# Patient Record
Sex: Female | Born: 1935 | Race: White | Hispanic: No | Smoking: Never smoker
Health system: Southern US, Community
[De-identification: ages and names within clinical notes are randomized; demographics above are authoritative.]

## PROBLEM LIST (undated history)

## (undated) DIAGNOSIS — I499 Cardiac arrhythmia, unspecified: Secondary | ICD-10-CM

## (undated) DIAGNOSIS — D649 Anemia, unspecified: Secondary | ICD-10-CM

## (undated) DIAGNOSIS — M1990 Unspecified osteoarthritis, unspecified site: Secondary | ICD-10-CM

## (undated) DIAGNOSIS — I509 Heart failure, unspecified: Secondary | ICD-10-CM

## (undated) DIAGNOSIS — I10 Essential (primary) hypertension: Secondary | ICD-10-CM

## (undated) HISTORY — PX: CHOLECYSTECTOMY: SHX55

## (undated) HISTORY — PX: ABDOMINAL HYSTERECTOMY: SHX81

## (undated) HISTORY — DX: Cardiac arrhythmia, unspecified: I49.9

## (undated) HISTORY — DX: Anemia, unspecified: D64.9

## (undated) HISTORY — DX: Heart failure, unspecified: I50.9

## (undated) HISTORY — PX: JOINT REPLACEMENT: SHX530

---

## 2004-05-05 ENCOUNTER — Ambulatory Visit: Payer: Self-pay | Admitting: Family Medicine

## 2004-10-18 ENCOUNTER — Ambulatory Visit: Payer: Self-pay | Admitting: Gastroenterology

## 2005-01-30 ENCOUNTER — Other Ambulatory Visit: Payer: Self-pay

## 2005-02-07 ENCOUNTER — Inpatient Hospital Stay: Payer: Self-pay | Admitting: Unknown Physician Specialty

## 2005-05-22 ENCOUNTER — Ambulatory Visit: Payer: Self-pay | Admitting: Family Medicine

## 2006-06-13 ENCOUNTER — Ambulatory Visit: Payer: Self-pay | Admitting: Family Medicine

## 2007-07-24 ENCOUNTER — Ambulatory Visit: Payer: Self-pay | Admitting: Family Medicine

## 2008-10-20 ENCOUNTER — Ambulatory Visit: Payer: Self-pay | Admitting: Family Medicine

## 2009-06-09 ENCOUNTER — Ambulatory Visit: Payer: Self-pay | Admitting: Internal Medicine

## 2009-10-25 ENCOUNTER — Ambulatory Visit: Payer: Self-pay | Admitting: Family Medicine

## 2010-09-28 ENCOUNTER — Ambulatory Visit: Payer: Self-pay | Admitting: Internal Medicine

## 2010-10-04 ENCOUNTER — Ambulatory Visit: Payer: Self-pay | Admitting: Internal Medicine

## 2010-10-27 ENCOUNTER — Ambulatory Visit: Payer: Self-pay | Admitting: Family Medicine

## 2010-11-01 ENCOUNTER — Ambulatory Visit: Payer: Self-pay | Admitting: Family Medicine

## 2011-04-12 ENCOUNTER — Other Ambulatory Visit: Payer: Self-pay | Admitting: Unknown Physician Specialty

## 2011-05-05 ENCOUNTER — Ambulatory Visit: Payer: Self-pay | Admitting: Family Medicine

## 2011-07-02 ENCOUNTER — Emergency Department: Payer: Self-pay | Admitting: Emergency Medicine

## 2011-09-24 ENCOUNTER — Emergency Department: Payer: Self-pay | Admitting: Internal Medicine

## 2012-05-01 LAB — CBC
HCT: 41.9 % (ref 35.0–47.0)
MCHC: 34.1 g/dL (ref 32.0–36.0)
MCV: 100 fL (ref 80–100)
RDW: 12.6 % (ref 11.5–14.5)
WBC: 10.3 10*3/uL (ref 3.6–11.0)

## 2012-05-01 LAB — BASIC METABOLIC PANEL
Anion Gap: 11 (ref 7–16)
BUN: 15 mg/dL (ref 7–18)
Creatinine: 1.07 mg/dL (ref 0.60–1.30)
EGFR (Non-African Amer.): 47 — ABNORMAL LOW
Glucose: 209 mg/dL — ABNORMAL HIGH (ref 65–99)
Osmolality: 283 (ref 275–301)
Potassium: 3.6 mmol/L (ref 3.5–5.1)

## 2012-05-02 ENCOUNTER — Inpatient Hospital Stay: Payer: Self-pay | Admitting: Internal Medicine

## 2012-05-02 LAB — URINALYSIS, COMPLETE
Bilirubin,UR: NEGATIVE
Hyaline Cast: 10
Ketone: NEGATIVE
Leukocyte Esterase: NEGATIVE
Protein: 100
RBC,UR: 3 /HPF (ref 0–5)
Squamous Epithelial: NONE SEEN
WBC UR: 2 /HPF (ref 0–5)

## 2012-05-02 LAB — CK TOTAL AND CKMB (NOT AT ARMC)
CK, Total: 39 U/L (ref 21–215)
CK, Total: 47 U/L (ref 21–215)
CK-MB: 1 ng/mL (ref 0.5–3.6)

## 2012-05-02 LAB — TROPONIN I: Troponin-I: <0.02 ng/mL

## 2012-05-02 LAB — TSH: Thyroid Stimulating Horm: 1.84 u[IU]/mL

## 2012-05-03 LAB — URINE CULTURE

## 2012-05-03 LAB — PROTIME-INR: Prothrombin Time: 30.8 secs — ABNORMAL HIGH (ref 11.5–14.7)

## 2013-10-19 DIAGNOSIS — I480 Paroxysmal atrial fibrillation: Secondary | ICD-10-CM

## 2014-01-02 ENCOUNTER — Emergency Department: Payer: Self-pay | Admitting: Emergency Medicine

## 2014-06-13 ENCOUNTER — Emergency Department: Payer: Self-pay | Admitting: Emergency Medicine

## 2014-10-27 NOTE — Discharge Summary (Signed)
PATIENT NAME:  Gina White, Gina White MR#:  [id number] DATE OF BIRTH:  [birth year]  DATE OF ADMISSION:  05/02/2012 DATE OF DISCHARGE:  05/03/2012  ADMITTING PHYSICIAN: Dr. Enid Baasadhika Geoffrey Mankin DISCHARGING PHYSICIAN: Dr. Enid Baasadhika Binnie Droessler  PRIMARY CARE PHYSICIAN: Dr. Wonda ChengJoel White PRIMARY CARDIOLOGIST: Dr. Lady White  DISCHARGE DIAGNOSES:  1. Atrial fibrillation with rapid ventricular response.  2. Allergic reaction with erythroderma unknown allergen.  3. Hypertension.  4. Hyperlipidemia.  5. Constipation.   DISCHARGE HOME MEDICATIONS:  1. Allegra 180 mg p.o. daily.  2. Coumadin 4 mg p.o. daily.  3. Potassium chloride 10 mEq p.o. daily.  4. Multivitamin 1 tablet p.o. daily.  5. Atorvastatin 20 mg p.o. daily.  6. Detrol LA 4 mg p.o. daily.  7. Calcium vitamin D 600 mg/200 international units 1 tablet t.i.d.  8. Flonase 50 mcg inhalation, nasal spray daily. 9. Glucosamine/chondroitin sulfate 1 tablet p.o. t.i.d.  10. Senna + 50/8.6 mg 2 tablets once a day.  11. Diazepam 2.5 mg at bedtime as needed.  12. Metoprolol 50 mg p.o. b.i.d.  13. Lasix 20 mg p.o. daily.  14. Prednisone start at 30 mg and taper off x10 mg every day.   DISCHARGE DIET: Low sodium diet.   DISCHARGE ACTIVITY: As tolerated.   FOLLOW UP INSTRUCTIONS:  1. Follow up in 1 to 2 weeks with PCP.  2. Follow-up with Dr. Lady White in 3 to 4 weeks for atrial fibrillation. 3. Allergy and immunology follow up in two weeks.   LABORATORY, DIAGNOSTIC AND RADIOLOGICAL DATA: INR at the time of discharge 2.9. CK and CK-MB and troponins have been negative. IgE is elevated at 331.   Urinalysis negative for any infection. Urine cultures are negative. Chest x-ray showing no acute disease of the chest.   WBC 10.3, hemoglobin 14.3, hematocrit 41.9, platelet count 268.   Sodium 138, potassium 3.6, chloride 104, bicarbonate 23, BUN 15, creatinine 1.07, glucose 209, calcium 8.9.   BRIEF HOSPITAL COURSE: Gina White is an 81 year old, very pleasant  Caucasian female with history of hypertension, atrial fibrillation on Coumadin, hyperlipidemia who came in secondary to sudden onset of dyspnea and also whole body erythema, like erythroderma. Denies any use of new medications. Had eaten liver pudding which she hadn't eaten in the past but has been more than six months. No other allergen was identified. She was also found to be in atrial fibrillation with RVR on admission.  1. Atrial fibrillation and rapid ventricular response likely secondary to underlying hypoxia from sudden onset of allergic reaction. She was given IV Cardizem, rate slowed down, admitted to telemetry and metoprolol increased to 50 mg twice a day. Heart rate is controlled at 80 to 90 beats per minute at this time. She will follow with Dr. Lady White as an outpatient. She is also on Coumadin as an outpatient and her INR has been therapeutic while in the hospital.  2. Allergic reaction with erythroderma, redness all over the body, when she came in she was also found to be in acute respiratory failure at the same time and was hypoxic in 60s. She was placed on BiPAP and currently weaned down to room air, doing well. She was started on Solu-Medrol, Pepcid IV and Benadryl for her allergic reaction with she has symptomatically responded. Currently she does not have any rash on her body and sats are well on ambulating too. She was tapered to prednisone and will finish off her course in the next three days. Benadryl has been stopped and was asked to use as needed.  IgE level is elevated, unknown allergen has triggered the reaction so she was advised to follow up with allergist and immunologist as an outpatient. 3. Her other home medications for hypertension, hyperlipemia have been continued without any further changes. Her course has been uneventful while in the hospital.   DISCHARGE CONDITION: Stable.   DISCHARGE DISPOSITION: Home.   TIME SPENT ON DISCHARGE: 45  minutes.  ____________________________ Enid Baas, MD rk:cms D: 05/03/2012 13:51:37 ET T: 05/04/2012 15:34:30 ET JOB#: [id number]  cc: Enid Baas, MD, <Dictator> Durward Mallard. Marguerite Olea, MD Darlin Priestly. Gina Gary, MD Enid Baas MD ELECTRONICALLY SIGNED 05/08/2012 14:15

## 2014-10-27 NOTE — H&P (Signed)
PATIENT NAME:  Gina White, TOWSON MR#:  [id number] DATE OF BIRTH:  1924/06/25  DATE OF ADMISSION:  05/02/2012  ADMITTING PHYSICIAN: Enid Baas, MD    PRIMARY MD: Dr. Marguerite Olea   PRIMARY CARDIOLOGIST: Dr. Lady Gary     CHIEF COMPLAINT: Difficulty breathing, redness all over the body, and also hypoxia.   HISTORY OF PRESENT ILLNESS: Gina White is a pleasant 81 year old elderly Caucasian female with past medical history significant for atrial fibrillation on Coumadin and hypertension who lives at home with her daughter who was brought in secondary to sudden onset of dyspnea and was found to be hypoxic, atrial fibrillation, RVR, and erythroderma all over the body this evening. The patient was on BiPAP when I initially examined her and her daughters are at bedside who give most of the history. According to them, the patient lives with one of the daughters named Gina White. Both of them had her supper around 6 p.m. tonight. They went to their bedrooms and suddenly about 9 p.m. the patient called to the daughter saying that she could not breathe. When the daughter came over she was found to be using her accessory muscles, in distress so 9-1-1 was called. She was hypoxic with sats in the 60's when EMS arrived and also was found to be in atrial fibrillation with RVR, heart rate greater than 120. According to daughter, she initially turned pink all over from head to toe and then became extremely red, beet red color. The patient denies eating any unusual stuff. She had a taco, liver pudding and soup for her supper which she had eaten yesterday too. She is not allergic to any ingredients in that. She has not been started on any new medications or hasn't taken any new medications after supper. She did respond, however, to a dose of IV Solu-Medrol and Benadryl here in the ED and her redness is improving at this time and her breathing is better. She is 100% on 35% FiO2 with 12/6 pressure on the BiPAP. She is now being  changed to nasal cannula and her sats are still 100% on 2 liters at this time and she is not using her accessory muscles for breathing.   PAST MEDICAL HISTORY:  1. Hypertension.  2. Hyperthyroidism, status post radioactive iodine ablation.  3. Atrial fibrillation, on Coumadin.  4. Hyperlipidemia.   PAST SURGICAL HISTORY:  1. Bilateral total knee replacement.  2. Lump removed from her breast, which was benign.  3. Total hysterectomy.  4. Cholecystectomy.  5. Bilateral cataract surgery.  6. Cervical spine surgery.   ALLERGIES TO MEDICATIONS: She is allergic to penicillin, hydrocodone, Septra, Zithromax, and erythromycin.   HOME MEDICATIONS:  1. Metoprolol 50 mg p.o. daily.  2. Allegra 180 mg p.o. daily.  3. Warfarin 4 mg p.o. daily.  4. Potassium 10 mEq p.o. daily.  5. Atorvastatin 20 mg p.o. daily.  6. Aspirin 81 mg p.o. daily.  7. Calcium with Vitamin D 600 mg plus 200 international units 1 tablet twice a day.  8. Multivitamin 1 tablet p.o. daily.  9. Diazepam 2.5 mg at bedtime for sleeping.  10. Fluticasone nasal spray once a day.  11. Chlorhexidine mouthwash twice a day.  12. Glucosamine/chondroitin sulfate 1 tablet twice a day.  13. Benadryl as needed.  14. Senna Plus 2 tablets at bedtime.  15. Lasix 40 mg p.o. daily.  16. Tylenol 500 mg q.6 hours p.r.n.  17. 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   SOCIAL HISTORY: Lives at home with her daughter. No smoking or alcohol use.   FAMILY HISTORY: Dad had emphysema. Mom lived up to 50's and died from old age.   REVIEW OF SYSTEMS: CONSTITUTIONAL: No fever, fatigue, or weakness. EYES: No blurred vision, double vision, glaucoma, or inflammation. Positive for cataract surgery. ENT: No tinnitus, ear pain, epistaxis, or discharge. RESPIRATORY: No cough or wheezing but she did come with severe dyspnea when she initially came in. CARDIOVASCULAR: No chest pain,  orthopnea, edema, arrhythmia, palpitations, or syncope. GI: No nausea, vomiting, abdominal pain, hematemesis, or melena. GU: No dysuria, hematuria, renal calculus, frequency, or incontinence. ENDOCRINE: No polyuria or nocturia. History of thyroid nodule present. No heat or cold intolerance. HEMATOLOGY: No anemia, easy bruising or bleeding. SKIN: Positive for extreme redness of whole body. She came in improving. MUSCULOSKELETAL: No neck pain, shoulder pain, arthritis, or gout. NEUROLOGIC: No numbness, weakness, CVA, TIA, or seizures. PSYCHOLOGICAL: No anxiety, insomnia, or depression.   PHYSICAL EXAMINATION:   VITAL SIGNS: Temperature 97 degrees Fahrenheit, pulse 91, respirations 24, blood pressure 131/66, pulse oximetry 100% on BiPAP.  GENERAL: Elderly female well built, well nourished sitting in bed not in any acute distress.   HEENT: Normocephalic, atraumatic. Pupils equal, round, reacting to light. Anicteric sclerae. Extraocular movements intact. Oropharynx clear without erythema, mass, or exudates.   NECK: Supple. No thyromegaly. No JVD or carotid bruits. No lymphadenopathy.   LUNGS: She is moving air bilaterally. No wheeze or crackles. No use of accessory muscles for breathing.   CARDIOVASCULAR: S1, S2. Irregular rhythm. Normal rate. No murmurs, rubs, or gallops.   ABDOMEN: Soft, nontender, nondistended. No hepatosplenomegaly. Normal bowel sounds.   EXTREMITIES: 1+ pedal edema. No clubbing or cyanosis. 2+ dorsalis pedis pulses palpable bilaterally.   SKIN: There is generalized redness of the skin more prominent in the lower extremities and also trunk especially on the back and, according to family, much improving now. There is occasional old bruising from being on Coumadin and sensitive skin but no open ulcers or rashes seen otherwise.   LYMPHATICS: No cervical lymphadenopathy.   NEUROLOGIC: Cranial nerves intact. No focal motor or sensory deficits.   PSYCHOLOGICAL: The patient is  awake, alert, oriented x3.   LABORATORY DATA: WBC 10.3, hemoglobin 14.3, hematocrit 41.9, platelet count 268, sodium 138, potassium 3.6, chloride 104, bicarb 23, BUN 15, creatinine 1.07, glucose 209, calcium 8.9. Troponin less than 0.02. INR 2.1. ABG showing pH 7.32, pCO2 46, pO2 514, bicarb 23.7, sats of 100% on 100% FiO2. Urinalysis showing 100 mg/dL of protein, 1+ bacteria, and 2 WBCs.   Chest x-ray showing hyperinflated lung fields with prominent right hilum pulmonary vascularity but no pleural effusion, pneumonia, or CHF findings are seen.   EKG atrial fibrillation with heart rate of 82.   ASSESSMENT AND PLAN: This is an 81 year old female with history of atrial fibrillation on Coumadin and hypertension brought in for hypoxia, sudden onset of dyspnea, atrial fibrillation with RVR. Also, the patient had erythroderma with the onset of dyspnea with possible allergic reaction.  1. Atrial fibrillation with rapid ventricular response likely secondary to hypoxia and dyspnea, required IV Cardizem push and now rate is slowed down. Will admit to telemetry. Continue metoprolol p.o. and will add IV metoprolol p.r.n. if needed. She is on Coumadin for anticoagulation. Her INR is therapeutic and she follows with Dr. Lady Gary as outpatient.  2. Hypoxic respiratory failure with sudden onset of dyspnea and erythroderma, possible allergic reaction, unknown  triggering factor. No new medications. Not sure if the food she ate had some new ingredient in it, however, she denies. Will get IgE level and might need allergy testing as an outpatient. Was on BiPAP for her hypoxia. Sats improved. No change to nasal cannula. She did get Benadryl and Solu-Medrol here so will continue Benadryl p.r.n. and Solu-Medrol IV q.8 hours and IV Pepcid. Monitor on telemetry for now.  3. Hyperlipidemia. Continue statin.  4. Constipation. Senna and Colace.  5. GI and DVT prophylaxis. On ranitidine and also Coumadin.   CODE STATUS: FULL CODE.         TIME SPENT ON ADMISSION: 50 minutes.   ____________________________ Enid Baasadhika Francessca Friis, MD rk:drc D: 05/02/2012 01:32:46 ET T: 05/02/2012 07:45:48 ET JOB#: [id number]  cc: Enid Baasadhika Isidro Monks, MD, <Dictator> Durward MallardJoel B. Marguerite OleaMoffett, MD Darlin PriestlyKenneth A. Lady GaryFath, MD Enid BaasADHIKA Aki Burdin MD ELECTRONICALLY SIGNED 05/03/2012 11:26

## 2015-03-19 ENCOUNTER — Other Ambulatory Visit: Payer: Self-pay | Admitting: Otolaryngology

## 2015-03-19 DIAGNOSIS — H905 Unspecified sensorineural hearing loss: Secondary | ICD-10-CM

## 2015-03-26 ENCOUNTER — Ambulatory Visit: Payer: Medicare Other

## 2015-11-04 ENCOUNTER — Emergency Department
Admission: EM | Admit: 2015-11-04 | Discharge: 2015-11-05 | Disposition: A | Discharge status: Home / Self Care | Payer: Medicare Other | Attending: Emergency Medicine | Admitting: Emergency Medicine

## 2015-11-04 DIAGNOSIS — T7840XA Allergy, unspecified, initial encounter: Secondary | ICD-10-CM | POA: Insufficient documentation

## 2015-11-04 DIAGNOSIS — Z79899 Other long term (current) drug therapy: Secondary | ICD-10-CM | POA: Diagnosis not present

## 2015-11-04 DIAGNOSIS — Z880 Allergy status to penicillin: Secondary | ICD-10-CM | POA: Diagnosis not present

## 2015-11-04 DIAGNOSIS — Z7982 Long term (current) use of aspirin: Secondary | ICD-10-CM | POA: Insufficient documentation

## 2015-11-04 DIAGNOSIS — I4891 Unspecified atrial fibrillation: Secondary | ICD-10-CM | POA: Diagnosis not present

## 2015-11-04 DIAGNOSIS — R21 Rash and other nonspecific skin eruption: Secondary | ICD-10-CM | POA: Diagnosis present

## 2015-11-04 DIAGNOSIS — L509 Urticaria, unspecified: Secondary | ICD-10-CM | POA: Diagnosis not present

## 2015-11-04 MED ORDER — FAMOTIDINE IN NACL 20-0.9 MG/50ML-% IV SOLN
20.0000 mg | Freq: Once | INTRAVENOUS | Status: AC
  Administered 2015-11-05: 20 mg via INTRAVENOUS
  Filled 2015-11-04: qty 50

## 2015-11-04 MED ORDER — DIPHENHYDRAMINE HCL 50 MG/ML IJ SOLN
12.5000 mg | Freq: Once | INTRAMUSCULAR | Status: AC
  Administered 2015-11-05: 12.5 mg via INTRAVENOUS
  Filled 2015-11-04: qty 1

## 2015-11-04 MED ORDER — METHYLPREDNISOLONE SODIUM SUCC 125 MG IJ SOLR
125.0000 mg | Freq: Once | INTRAMUSCULAR | Status: AC
  Administered 2015-11-05: 125 mg via INTRAVENOUS
  Filled 2015-11-04: qty 2

## 2015-11-04 NOTE — ED Notes (Signed)
Pt c/o rash to RT arm and lower legs, rash is scattered throughout. Family states around 1500 rash was very slight and has now spread. Rt arm is red and raised. PT c/o severe itching but no pain. Denies any new meds or household products

## 2015-11-04 NOTE — ED Provider Notes (Signed)
[facility] Emergency Department Provider Note  ____________________________________________  Time seen: Approximately 11:15 PM  I have reviewed the triage vital signs and the nursing notes.   HISTORY  Chief Complaint Rash    HPI Gina White is a 82 y.o. female who presents to the ED from home with a chief complaint of rash. Patient noted a quarter-sized hive to her right upper extremity this afternoon. Subsequently the area spread to her medial humerus with areas of blistering. Also note spread of hives to her lower extremities and trunk. Denies associated respiratory distress, tongue or facial swelling, or difficulty swallowing. Denies new medicines, including antibiotics and over-the-counter medications. Denies new foods, lotions, detergents, soaps or other exposures. Family does note that patient worked in the yard yesterday pulling out weeds. Denies recent travel or trauma. Denies associated fever, chills, chest pain, shortness of breath, abdominal pain, nausea, vomiting, diarrhea. Patient took one Benadryl approximately 2 PM without relief of symptoms. She has an EpiPen at home but did not use it for the symptoms which did not involve her airway. Daughter states EpiPen was prescribed for an episode of lethargy which required hospitalization; states etiology of that allergic reaction was unknown.   Past medical history Atrial fibrillation  There are no active problems to display for this patient.   No past surgical history on file.  Current Outpatient Rx  Name  Route  Sig  Dispense  Refill  . acetaminophen (TYLENOL) 500 MG tablet   Oral   Take 500 mg by mouth every 6 (six) hours as needed.         Marland Kitchen aspirin EC 81 MG tablet   Oral   Take 81 mg by mouth daily.         Marland Kitchen atorvastatin (LIPITOR) 20 MG tablet   Oral   Take 20 mg by mouth daily at 6 PM.         . bisacodyl (DULCOLAX) 5 MG EC tablet   Oral   Take 5 mg by mouth daily as  needed for moderate constipation.         . Calcium Carbonate-Vitamin D (CALCIUM 600+D) 600-400 MG-UNIT tablet   Oral   Take 2 tablets by mouth daily.         . cetirizine (ZYRTEC) 10 MG tablet   Oral   Take 10 mg by mouth daily.         Marland Kitchen dexamethasone 0.5 MG/5ML elixir   Oral   Take 1 mg by mouth 3 (three) times daily.         Marland Kitchen dextromethorphan-guaiFENesin (ROBITUSSIN-DM) 10-100 MG/5ML liquid   Oral   Take 10 mLs by mouth every 4 (four) hours as needed for cough.         . diazepam (VALIUM) 5 MG tablet   Oral   Take 5 mg by mouth at bedtime as needed for anxiety.         . diphenhydrAMINE (SOMINEX) 25 MG tablet   Oral   Take 25 mg by mouth at bedtime as needed for sleep.         Marland Kitchen EPINEPHrine 0.3 mg/0.3 mL IJ SOAJ injection   Intramuscular   Inject 0.3 mg into the muscle once.         . fluticasone (FLONASE) 50 MCG/ACT nasal spray   Each Nare   Place 2 sprays into both nostrils daily.         . furosemide (LASIX) 20 MG tablet   Oral  Take 40 mg by mouth daily.         Marland Kitchen gabapentin (NEURONTIN) 300 MG capsule   Oral   Take 300-600 mg by mouth at bedtime.          Marland Kitchen glucosamine-chondroitin 500-400 MG tablet   Oral   Take 1 tablet by mouth 2 (two) times daily.         Marland Kitchen HYDROcodone-acetaminophen (NORCO/VICODIN) 5-325 MG tablet   Oral   Take 1 tablet by mouth every 8 (eight) hours as needed for moderate pain.         Marland Kitchen HYDROcodone-homatropine (HYCODAN) 5-1.5 MG/5ML syrup   Oral   Take 5 mLs by mouth daily as needed for cough.         . metaxalone (SKELAXIN) 800 MG tablet   Oral   Take 800 mg by mouth 3 (three) times daily.         . metoprolol succinate (TOPROL-XL) 50 MG 24 hr tablet   Oral   Take 50 mg by mouth 2 (two) times daily. Take with or immediately following a meal.         . Multiple Vitamins-Minerals (MULTIVITAMIN WITH MINERALS) tablet   Oral   Take 1 tablet by mouth daily.         . potassium chloride  (MICRO-K) 10 MEQ CR capsule   Oral   Take 10 mEq by mouth daily.         . sennosides-docusate sodium (SENOKOT-S) 8.6-50 MG tablet   Oral   Take 1 tablet by mouth daily.         . traMADol (ULTRAM) 50 MG tablet   Oral   Take by mouth every 6 (six) hours as needed.         . triamcinolone (KENALOG) 0.1 % paste   Mouth/Throat   Use as directed 1 application in the mouth or throat 2 (two) times daily.         Marland Kitchen warfarin (COUMADIN) 5 MG tablet   Oral   Take 5 mg by mouth daily.           Allergies Penicillins  No family history on file.  Social History Social History  Substance Use Topics  . Smoking status: Not on file  . Smokeless tobacco: Not on file  . Alcohol Use: Not on file  Nonsmoker  Review of Systems  Constitutional: No fever/chills. Eyes: No visual changes. ENT: No sore throat. Cardiovascular: Denies chest pain. Respiratory: Denies shortness of breath. Gastrointestinal: No abdominal pain.  No nausea, no vomiting.  No diarrhea.  No constipation. Genitourinary: Negative for dysuria. Musculoskeletal: Negative for back pain. Skin: Positive for rash. Neurological: Negative for headaches, focal weakness or numbness.  10-point ROS otherwise negative.  ____________________________________________   PHYSICAL EXAM:  VITAL SIGNS: ED Triage Vitals  Enc Vitals Group     BP 11/04/15 2152 114/81 mmHg     Pulse Rate 11/04/15 2152 88     Resp 11/04/15 2152 18     Temp 11/04/15 2152 98.2 F (36.8 C)     Temp Source 11/04/15 2152 Oral     SpO2 11/04/15 2152 98 %     Weight 11/04/15 2152 154 lb (69.854 kg)     Height 11/04/15 2152  (1.626 m)     Head Cir --      Peak Flow --      Pain Score 11/04/15 2247 0     Pain Loc --  Pain Edu? --      Excl. in GC? --     Constitutional: Alert and oriented. Well appearing and in no acute distress. Eyes: Conjunctivae are normal. PERRL. EOMI. Head: Atraumatic. Nose: No  congestion/rhinnorhea. Mouth/Throat: Mucous membranes are moist.  Oropharynx non-erythematous.  There is no angioedema, tongue swelling, hoarse/muffled voice, or drooling.  Small contusion left lower chin which patient reports is from her warfarin use. Neck: No stridor.  No brawny edema. Cardiovascular: Normal rate, irregular rhythm. Grossly normal heart sounds.  Good peripheral circulation. Respiratory: Normal respiratory effort.  No retractions. Lungs CTAB. Gastrointestinal: Soft and nontender. No distention. No abdominal bruits. No CVA tenderness. Musculoskeletal: No lower extremity tenderness nor edema.  No joint effusions. Neurologic:  Normal speech and language. No gross focal neurologic deficits are appreciated. No gait instability. Skin:  Skin is warm, dry and intact. No petechiae. Large area to right upper extremity with patchy urticaria and areas of blistering/bulla. Bulla is not tense. There is no Nikolsky's sign. Scattered urticaria to bilateral upper legs, left forearm and trunk, particularly the waistband. Psychiatric: Mood and affect are normal. Speech and behavior are normal.  ____________________________________________   LABS (all labs ordered are listed, but only abnormal results are displayed)  Labs Reviewed  CBC WITH DIFFERENTIAL/PLATELET - Abnormal; Notable for the following:    RBC 3.75 (*)    Hemoglobin 11.6 (*)    HCT 34.7 (*)    All other components within normal limits  BASIC METABOLIC PANEL - Abnormal; Notable for the following:    Potassium 3.1 (*)    Glucose, Bld 113 (*)    GFR calc non Af Amer 54 (*)    All other components within normal limits  PROTIME-INR - Abnormal; Notable for the following:    Prothrombin Time 24.5 (*)    All other components within normal limits    ____________________________________________  EKG  None ____________________________________________  RADIOLOGY  None ____________________________________________   PROCEDURES  Procedure(s) performed: None  Critical Care performed: No  ____________________________________________   INITIAL IMPRESSION / ASSESSMENT AND PLAN / ED COURSE  Pertinent labs & imaging results that were available during my care of the patient were reviewed by me and considered in my medical decision making (see chart for details).  82 year old female who presents with pruritic rash with urticaria and scattered blistering to right upper extremity. Denies new medicines or exposures other than gardening. Will obtain screening lab work including INR, administer IV Benadryl, Solu-Medrol, Pepcid and reassess.  ----------------------------------------- 12:48 AM on 11/05/2015 -----------------------------------------  Updated patient and family members of laboratory results. Patient reports pruritus is improved. No new areas of urticaria or blistering. Will continue to monitor.  ----------------------------------------- 2:51 AM on 11/05/2015 -----------------------------------------  Patient smiling visiting with her daughters. Urticaria improved. Room air saturations 98%. There is no angioedema or tongue swelling. Plan for low-dose prednisone for the next 4 days, Pepcid 4 days and follow-up in 12-18 hours either with patient's PCP or return to the ED for recheck. Strict return precautions given. All verbalize understanding and agree with plan of care. ____________________________________________   FINAL CLINICAL IMPRESSION(S) / ED DIAGNOSES  Final diagnoses:  Allergic reaction, initial encounter  Urticaria      Irean HongJade J Torre Pikus, MD 11/05/15 [phone] [id number]

## 2015-11-04 NOTE — ED Notes (Signed)
Pt in with co rash all over with some blistering area to right arm, no injury or hx of the same.  States no pain but it does itch.

## 2015-11-05 LAB — CBC WITH DIFFERENTIAL/PLATELET
Basophils Absolute: 0 10*3/uL (ref 0–0.1)
Basophils Relative: 0 %
EOS ABS: 0.2 10*3/uL (ref 0–0.7)
EOS PCT: 2 %
HCT: 34.7 % — ABNORMAL LOW (ref 35.0–47.0)
Hemoglobin: 11.6 g/dL — ABNORMAL LOW (ref 12.0–16.0)
LYMPHS ABS: 2.7 10*3/uL (ref 1.0–3.6)
Lymphocytes Relative: 28 %
MCH: 31 pg (ref 26.0–34.0)
MCHC: 33.6 g/dL (ref 32.0–36.0)
MCV: 92.3 fL (ref 80.0–100.0)
MONO ABS: 0.9 10*3/uL (ref 0.2–0.9)
Monocytes Relative: 9 %
NEUTROS PCT: 61 %
Neutro Abs: 5.7 10*3/uL (ref 1.4–6.5)
PLATELETS: 210 10*3/uL (ref 150–440)
RBC: 3.75 MIL/uL — AB (ref 3.80–5.20)
RDW: 14.2 % (ref 11.5–14.5)
WBC: 9.5 10*3/uL (ref 3.6–11.0)

## 2015-11-05 LAB — BASIC METABOLIC PANEL
ANION GAP: 8 (ref 5–15)
BUN: 14 mg/dL (ref 6–20)
CALCIUM: 9.2 mg/dL (ref 8.9–10.3)
CO2: 30 mmol/L (ref 22–32)
Chloride: 104 mmol/L (ref 101–111)
Creatinine, Ser: 0.91 mg/dL (ref 0.44–1.00)
GFR calc Af Amer: >60 mL/min (ref >60)
GFR, EST NON AFRICAN AMERICAN: 54 mL/min — AB (ref >60)
GLUCOSE: 113 mg/dL — AB (ref 65–99)
Potassium: 3.1 mmol/L — ABNORMAL LOW (ref 3.5–5.1)
SODIUM: 142 mmol/L (ref 135–145)

## 2015-11-05 LAB — PROTIME-INR
INR: 2.23
Prothrombin Time: 24.5 seconds — ABNORMAL HIGH (ref 11.4–15.0)

## 2015-11-05 MED ORDER — PREDNISONE 20 MG PO TABS: ORAL_TABLET | ORAL | Status: DC

## 2015-11-05 MED ORDER — FAMOTIDINE 20 MG PO TABS: 20.0000 mg | ORAL_TABLET | Freq: Two times a day (BID) | ORAL | Status: DC

## 2015-11-05 NOTE — Discharge Instructions (Signed)
1. Take the following medicines for the next 4 days: Prednisone  daily Pepcid  twice daily 2. Take Benadryl as needed for itching. 3. Use your Epi-Pen in case of acute, life-threatening allergic reaction which prevents you from breathing. 4. Please see your PCP today. If you are unable to see your PCP today, please return to the ER in 12-24 hours for recheck.  5. Return to the ER sooner for worsening symptoms, persistent vomiting, difficulty breathing or other concerns.  Hives Hives are itchy, red, swollen areas of the skin. They can vary in size and location on your body. Hives can come and go for hours or several days (acute hives) or for several weeks (chronic hives). Hives do not spread from person to person (noncontagious). They may get worse with scratching, exercise, and emotional stress. CAUSES   Allergic reaction to food, additives, or drugs.  Infections, including the common cold.  Illness, such as vasculitis, lupus, or thyroid disease.  Exposure to sunlight, heat, or cold.  Exercise.  Stress.  Contact with chemicals. SYMPTOMS   Red or white swollen patches on the skin. The patches may change size, shape, and location quickly and repeatedly.  Itching.  Swelling of the hands, feet, and face. This may occur if hives develop deeper in the skin. DIAGNOSIS  Your caregiver can usually tell what is wrong by performing a physical exam. Skin or blood tests may also be done to determine the cause of your hives. In some cases, the cause cannot be determined. TREATMENT  Mild cases usually get better with medicines such as antihistamines. Severe cases may require an emergency epinephrine injection. If the cause of your hives is known, treatment includes avoiding that trigger.  HOME CARE INSTRUCTIONS   Avoid causes that trigger your hives.  Take antihistamines as directed by your caregiver to reduce the severity of your hives. Non-sedating or low-sedating antihistamines  are usually recommended. Do not drive while taking an antihistamine.  Take any other medicines prescribed for itching as directed by your caregiver.  Wear loose-fitting clothing.  Keep all follow-up appointments as directed by your caregiver. SEEK MEDICAL CARE IF:   You have persistent or severe itching that is not relieved with medicine.  You have painful or swollen joints. SEEK IMMEDIATE MEDICAL CARE IF:   You have a fever.  Your tongue or lips are swollen.  You have trouble breathing or swallowing.  You feel tightness in the throat or chest.  You have abdominal pain. These problems may be the first sign of a life-threatening allergic reaction. Call your local emergency services (911 in [place] MAKE SURE YOU:   Understand these instructions.  Will watch your condition.  Will get help right away if you are not doing well or get worse.   This information is not intended to replace advice given to you by your health care provider. Make sure you discuss any questions you have with your health care provider.   Document Released: 06/26/2005 Document Revised: 07/01/2013 Document Reviewed: 09/19/2011 Elsevier Interactive Patient Education [employer]  Allergies An allergy is an abnormal reaction to a substance by the body's defense system (immune system). Allergies can develop at any age. WHAT CAUSES ALLERGIES? An allergic reaction happens when the immune system mistakenly reacts to a normally harmless substance, called an allergen, as if it were harmful. The immune system releases antibodies to fight the substance. Antibodies eventually release a chemical called histamine into the bloodstream. The release of histamine is meant to protect  the body from infection, but it also causes discomfort. An allergic reaction can be triggered by:  Eating an allergen.  Inhaling an allergen.  Touching an allergen. WHAT TYPES OF ALLERGIES ARE THERE? There are many types of  allergies. Common types include:  Seasonal allergies. People with this type of allergy are usually allergic to substances that are only present during certain seasons, such as molds and pollens.  Food allergies.  Drug allergies.  Insect allergies.  Animal dander allergies. WHAT ARE SYMPTOMS OF ALLERGIES? Possible allergy symptoms include:  Swelling of the lips, face, tongue, mouth, or throat.  Sneezing, coughing, or wheezing.  Nasal congestion.  Tingling in the mouth.  Rash.  Itching.  Itchy, red, swollen areas of skin (hives).  Watery eyes.  Vomiting.  Diarrhea.  Dizziness.  Lightheadedness.  Fainting.  Trouble breathing or swallowing.  Chest tightness.  Rapid heartbeat. HOW ARE ALLERGIES DIAGNOSED? Allergies are diagnosed with a medical and family history and one or more of the following:  Skin tests.  Blood tests.  A food diary. A food diary is a record of all the foods and drinks you have in a day and of all the symptoms you experience.  The results of an elimination diet. An elimination diet involves eliminating foods from your diet and then adding them back in one by one to find out if a certain food causes an allergic reaction. HOW ARE ALLERGIES TREATED? There is no cure for allergies, but allergic reactions can be treated with medicine. Severe reactions usually need to be treated at a hospital. HOW CAN REACTIONS BE PREVENTED? The best way to prevent an allergic reaction is by avoiding the substance you are allergic to. Allergy shots and medicines can also help prevent reactions in some cases. People with severe allergic reactions may be able to prevent a life-threatening reaction called anaphylaxis with a medicine given right after exposure to the allergen.   This information is not intended to replace advice given to you by your health care provider. Make sure you discuss any questions you have with your health care provider.   Document Released:  09/19/2002 Document Revised: 07/17/2014 Document Reviewed: 04/07/2014 Elsevier Interactive Patient Education [employer]

## 2016-04-02 ENCOUNTER — Ambulatory Visit
Admission: EM | Admit: 2016-04-02 | Discharge: 2016-04-02 | Disposition: A | Discharge status: Home / Self Care | Payer: Medicare Other | Attending: Family Medicine | Admitting: Family Medicine

## 2016-04-02 ENCOUNTER — Encounter: Payer: Self-pay | Admitting: *Deleted

## 2016-04-02 ENCOUNTER — Ambulatory Visit (INDEPENDENT_AMBULATORY_CARE_PROVIDER_SITE_OTHER): Payer: Medicare Other

## 2016-04-02 DIAGNOSIS — B9789 Other viral agents as the cause of diseases classified elsewhere: Principal | ICD-10-CM

## 2016-04-02 DIAGNOSIS — J069 Acute upper respiratory infection, unspecified: Secondary | ICD-10-CM

## 2016-04-02 HISTORY — DX: Essential (primary) hypertension: I10

## 2016-04-02 HISTORY — DX: Unspecified osteoarthritis, unspecified site: M19.90

## 2016-04-02 NOTE — ED Triage Notes (Signed)
Productive cough, fever up to 104 at times, head congestion and runny nose x3-4 days.

## 2016-04-02 NOTE — ED Provider Notes (Signed)
[facility]    CSN: [id number] Arrival date & time: 04/02/16  4098  First Provider Contact:  None       History   Chief Complaint Chief Complaint  Patient presents with  . Cough  . Fever  . Nasal Congestion  . Sore Throat    HPI Gina White is a 82 y.o. female.   The history is provided by the patient.  URI  Presenting symptoms: congestion, cough, fever and rhinorrhea   Severity:  Moderate Onset quality:  Sudden Duration:  4 days Timing:  Constant Progression:  Worsening Chronicity:  New Associated symptoms: no wheezing   Risk factors: being elderly, chronic cardiac disease, recent illness and sick contacts   Risk factors: no chronic kidney disease, no chronic respiratory disease, no diabetes mellitus, no immunosuppression and no recent travel     Past Medical History:  Diagnosis Date  . Arthritis   . Hypertension     There are no active problems to display for this patient.   Past Surgical History:  Procedure Laterality Date  . ABDOMINAL HYSTERECTOMY    . CHOLECYSTECTOMY    . JOINT REPLACEMENT      OB History    No data available       Home Medications    Prior to Admission medications   Medication Sig Start Date End Date Taking? Authorizing Provider  acetaminophen (TYLENOL) 500 MG tablet Take 500 mg by mouth every 6 (six) hours as needed.   Yes Historical Provider, MD  aspirin EC 81 MG tablet Take 81 mg by mouth daily.   Yes Historical Provider, MD  atorvastatin (LIPITOR) 20 MG tablet Take 20 mg by mouth daily at 6 PM.   Yes Historical Provider, MD  bisacodyl (DULCOLAX) 5 MG EC tablet Take 5 mg by mouth daily as needed for moderate constipation.   Yes Historical Provider, MD  Calcium Carbonate-Vitamin D (CALCIUM 600+D) 600-400 MG-UNIT tablet Take 2 tablets by mouth daily.   Yes Historical Provider, MD  cetirizine (ZYRTEC) 10 MG tablet Take 10 mg by mouth daily.   Yes Historical Provider, MD  dexamethasone 0.5 MG/5ML elixir Take  1 mg by mouth 3 (three) times daily.   Yes Historical Provider, MD  diazepam (VALIUM) 5 MG tablet Take 5 mg by mouth at bedtime as needed for anxiety.   Yes Historical Provider, MD  fluticasone (FLONASE) 50 MCG/ACT nasal spray Place 2 sprays into both nostrils daily.   Yes Historical Provider, MD  furosemide (LASIX) 20 MG tablet Take 40 mg by mouth daily.   Yes Historical Provider, MD  gabapentin (NEURONTIN) 300 MG capsule Take 300-600 mg by mouth at bedtime.    Yes Historical Provider, MD  glucosamine-chondroitin 500-400 MG tablet Take 1 tablet by mouth 2 (two) times daily.   Yes Historical Provider, MD  metoprolol succinate (TOPROL-XL) 50 MG 24 hr tablet Take 50 mg by mouth 2 (two) times daily. Take with or immediately following a meal.   Yes Historical Provider, MD  Multiple Vitamins-Minerals (MULTIVITAMIN WITH MINERALS) tablet Take 1 tablet by mouth daily.   Yes Historical Provider, MD  potassium chloride (MICRO-K) 10 MEQ CR capsule Take 10 mEq by mouth daily.   Yes Historical Provider, MD  traMADol (ULTRAM) 50 MG tablet Take by mouth every 6 (six) hours as needed.   Yes Historical Provider, MD  warfarin (COUMADIN) 5 MG tablet Take 5 mg by mouth daily.   Yes Historical Provider, MD  dextromethorphan-guaiFENesin (ROBITUSSIN-DM) 10-100 MG/5ML liquid  Take 10 mLs by mouth every 4 (four) hours as needed for cough.    Historical Provider, MD  diphenhydrAMINE (SOMINEX) 25 MG tablet Take 25 mg by mouth at bedtime as needed for sleep.    Historical Provider, MD  EPINEPHrine 0.3 mg/0.3 mL IJ SOAJ injection Inject 0.3 mg into the muscle once.    Historical Provider, MD  famotidine (PEPCID) 20 MG tablet Take 1 tablet (20 mg total) by mouth 2 (two) times daily. 11/05/15   Irean Hong, MD  HYDROcodone-acetaminophen (NORCO/VICODIN) 5-325 MG tablet Take 1 tablet by mouth every 8 (eight) hours as needed for moderate pain.    Historical Provider, MD  HYDROcodone-homatropine (HYCODAN) 5-1.5 MG/5ML syrup Take 5 mLs  by mouth daily as needed for cough.    Historical Provider, MD  metaxalone (SKELAXIN) 800 MG tablet Take 800 mg by mouth 3 (three) times daily.    Historical Provider, MD  predniSONE (DELTASONE) 20 MG tablet 2 tablets daily 4 days 11/05/15   Irean Hong, MD  sennosides-docusate sodium (SENOKOT-S) 8.6-50 MG tablet Take 1 tablet by mouth daily.    Historical Provider, MD  triamcinolone (KENALOG) 0.1 % paste Use as directed 1 application in the mouth or throat 2 (two) times daily.    Historical Provider, MD    Family History History reviewed. No pertinent family history.  Social History Social History  Substance Use Topics  . Smoking status: Never Smoker  . Smokeless tobacco: Current User    Types: Snuff  . Alcohol use No     Allergies   Penicillins   Review of Systems Review of Systems  Constitutional: Positive for fever.  HENT: Positive for congestion and rhinorrhea.   Respiratory: Positive for cough. Negative for wheezing.      Physical Exam Triage Vital Signs ED Triage Vitals  Enc Vitals Group     BP 04/02/16 0911 128/78     Pulse Rate 04/02/16 0911 98     Resp 04/02/16 0911 16     Temp 04/02/16 0911 97.6 F (36.4 C)     Temp Source 04/02/16 0911 Oral     SpO2 04/02/16 0911 98 %     Weight 04/02/16 0914 154 lb (69.9 kg)     Height 04/02/16 0914 5\' 2"  (1.575 m)     Head Circumference --      Peak Flow --      Pain Score --      Pain Loc --      Pain Edu? --      Excl. in GC? --    No data found.   Updated Vital Signs BP 128/78   Pulse 98   Temp 97.6 F (36.4 C) (Oral)   Resp 16   Ht 5\' 2"  (1.575 m)   Wt 154 lb (69.9 kg)   SpO2 98%   BMI 28.17 kg/m   Visual Acuity Right Eye Distance:   Left Eye Distance:   Bilateral Distance:    Right Eye Near:   Left Eye Near:    Bilateral Near:     Physical Exam  Constitutional: She appears well-developed and well-nourished. No distress.  HENT:  Head: Normocephalic and atraumatic.  Right Ear: Tympanic  membrane, external ear and ear canal normal.  Left Ear: Tympanic membrane, external ear and ear canal normal.  Nose: Mucosal edema and rhinorrhea present. No nose lacerations, sinus tenderness, nasal deformity, septal deviation or nasal septal hematoma. No epistaxis.  No foreign bodies. Right sinus exhibits maxillary  sinus tenderness and frontal sinus tenderness. Left sinus exhibits maxillary sinus tenderness and frontal sinus tenderness.  Mouth/Throat: Uvula is midline, oropharynx is clear and moist and mucous membranes are normal. No oropharyngeal exudate.  Eyes: Conjunctivae and EOM are normal. Pupils are equal, round, and reactive to light. Right eye exhibits no discharge. Left eye exhibits no discharge. No scleral icterus.  Neck: Normal range of motion. Neck supple. No thyromegaly present.  Cardiovascular: Normal rate, regular rhythm and normal heart sounds.   Pulmonary/Chest: Effort normal. No respiratory distress. She has no wheezes. She has rhonchi in the right lower field. She has no rales.  Lymphadenopathy:    She has no cervical adenopathy.  Skin: She is not diaphoretic.  Nursing note and vitals reviewed.    UC Treatments / Results  Labs (all labs ordered are listed, but only abnormal results are displayed) Labs Reviewed - No data to display  EKG  EKG Interpretation None       Radiology Dg Chest 2 View  Result Date: 04/02/2016 CLINICAL DATA:  Cough for 2 days.  Hypertension. EXAM: CHEST  2 VIEW COMPARISON:  05/01/2012 FINDINGS: Osteopenia. Patient rotated left on the frontal. Midline trachea. Moderate cardiomegaly. Atherosclerosis in the transverse aorta. No pleural effusion or pneumothorax. Clear lungs. No congestive failure. IMPRESSION: No acute cardiopulmonary disease. Cardiomegaly without congestive failure. Aortic atherosclerosis. Electronically Signed   By: Jeronimo GreavesKyle  Talbot M.D.   On: 04/02/2016 09:58    Procedures Procedures (including critical care  time)  Medications Ordered in UC Medications - No data to display   Initial Impression / Assessment and Plan / UC Course  I have reviewed the triage vital signs and the nursing notes.  Pertinent labs & imaging results that were available during my care of the patient were reviewed by me and considered in my medical decision making (see chart for details).  Clinical Course      Final Clinical Impressions(s) / UC Diagnoses   Final diagnoses:  Viral URI with cough    New Prescriptions New Prescriptions   No medications on file   1. x-ray results (negative) and diagnosis reviewed with patient 2. Recommend supportive treatment with  3. Follow-up prn if symptoms worsen or don't improve   Payton Mccallumrlando Wister Hoefle, MD 04/02/16 1036

## 2016-04-04 ENCOUNTER — Telehealth: Payer: Self-pay

## 2016-04-04 NOTE — Telephone Encounter (Signed)
Courtesy call back completed today after patients visit at [facility]. Patient improved and will follow up with their PCP if symptoms continue or worsen.   

## 2017-01-15 ENCOUNTER — Ambulatory Visit (INDEPENDENT_AMBULATORY_CARE_PROVIDER_SITE_OTHER): Payer: Medicare Other | Admitting: Vascular Surgery

## 2017-01-15 ENCOUNTER — Encounter (INDEPENDENT_AMBULATORY_CARE_PROVIDER_SITE_OTHER): Payer: Self-pay | Admitting: Vascular Surgery

## 2017-01-15 DIAGNOSIS — K219 Gastro-esophageal reflux disease without esophagitis: Secondary | ICD-10-CM

## 2017-01-15 DIAGNOSIS — I872 Venous insufficiency (chronic) (peripheral): Secondary | ICD-10-CM

## 2017-01-15 DIAGNOSIS — E782 Mixed hyperlipidemia: Secondary | ICD-10-CM | POA: Diagnosis not present

## 2017-01-15 DIAGNOSIS — I890 Lymphedema, not elsewhere classified: Secondary | ICD-10-CM

## 2017-01-15 DIAGNOSIS — I10 Essential (primary) hypertension: Secondary | ICD-10-CM | POA: Diagnosis not present

## 2017-01-17 DIAGNOSIS — E785 Hyperlipidemia, unspecified: Secondary | ICD-10-CM | POA: Insufficient documentation

## 2017-01-17 DIAGNOSIS — I10 Essential (primary) hypertension: Secondary | ICD-10-CM | POA: Insufficient documentation

## 2017-01-17 DIAGNOSIS — K219 Gastro-esophageal reflux disease without esophagitis: Secondary | ICD-10-CM | POA: Insufficient documentation

## 2017-01-17 DIAGNOSIS — I890 Lymphedema, not elsewhere classified: Secondary | ICD-10-CM | POA: Insufficient documentation

## 2017-01-17 DIAGNOSIS — I872 Venous insufficiency (chronic) (peripheral): Secondary | ICD-10-CM | POA: Insufficient documentation

## 2017-01-17 NOTE — Progress Notes (Signed)
MRN : [id number]  Gina White is a 83 y.o. (09-07-1923) female who presents with chief complaint of  Chief Complaint  Patient presents with  . Follow-up  .  History of Present Illness:The patient returns to the office for followup evaluation regarding leg swelling.  The swelling has persisted but with the lymph pump is much, much better controlled. The pain associated with swelling is essentially eliminated. There have not been any interval development of a ulcerations or wounds.  The patient denies problems with the pump, noting it is working well and the leggings are in good condition.  Since the previous visit the patient has been wearing graduated compression stockings and using the lymph pump on a routine basis and  has noted significant improvement in the lymphedema.   Patient stated the lymph pump has been a very positive factor in her care.    Current Meds  Medication Sig  . aspirin EC 81 MG tablet Take 81 mg by mouth daily.  Marland Kitchen. atorvastatin (LIPITOR) 20 MG tablet Take 20 mg by mouth daily at 6 PM.  . bisacodyl (DULCOLAX) 5 MG EC tablet Take 5 mg by mouth daily as needed for moderate constipation.  . Calcium Carbonate-Vitamin D (CALCIUM 600+D) 600-400 MG-UNIT tablet Take 2 tablets by mouth daily.  . cetirizine (ZYRTEC) 10 MG tablet Take 10 mg by mouth daily.  Marland Kitchen. dexamethasone 0.5 MG/5ML elixir Take 1 mg by mouth 3 (three) times daily.  Marland Kitchen. dextromethorphan-guaiFENesin (ROBITUSSIN-DM) 10-100 MG/5ML liquid Take 10 mLs by mouth every 4 (four) hours as needed for cough.  . diazepam (VALIUM) 5 MG tablet Take 5 mg by mouth at bedtime as needed for anxiety.  . diphenhydrAMINE (SOMINEX) 25 MG tablet Take 25 mg by mouth at bedtime as needed for sleep.  Marland Kitchen. EPINEPHrine 0.3 mg/0.3 mL IJ SOAJ injection Inject 0.3 mg into the muscle once.  . fluticasone (FLONASE) 50 MCG/ACT nasal spray Place 2 sprays into both nostrils daily.  . furosemide (LASIX) 20 MG tablet Take 40 mg by mouth daily.    Marland Kitchen. gabapentin (NEURONTIN) 300 MG capsule Take 300-600 mg by mouth at bedtime.   Marland Kitchen. glucosamine-chondroitin 500-400 MG tablet Take 1 tablet by mouth 2 (two) times daily.  . metoprolol succinate (TOPROL-XL) 50 MG 24 hr tablet Take 50 mg by mouth 2 (two) times daily. Take with or immediately following a meal.  . Multiple Vitamins-Minerals (MULTIVITAMIN WITH MINERALS) tablet Take 1 tablet by mouth daily.  . potassium chloride (MICRO-K) 10 MEQ CR capsule Take 10 mEq by mouth daily.  . sennosides-docusate sodium (SENOKOT-S) 8.6-50 MG tablet Take 1 tablet by mouth daily.  . traMADol (ULTRAM) 50 MG tablet Take by mouth every 6 (six) hours as needed.  . triamcinolone (KENALOG) 0.1 % paste Use as directed 1 application in the mouth or throat 2 (two) times daily.  Marland Kitchen. warfarin (COUMADIN) 5 MG tablet Take 5 mg by mouth daily.    Past Medical History:  Diagnosis Date  . Arthritis   . Hypertension     Past Surgical History:  Procedure Laterality Date  . ABDOMINAL HYSTERECTOMY    . CHOLECYSTECTOMY    . JOINT REPLACEMENT      Social History Social History  Substance Use Topics  . Smoking status: Never Smoker  . Smokeless tobacco: Current User    Types: Snuff  . Alcohol use No    Family History No family history on file.  Allergies  Allergen Reactions  . Azithromycin  Other reaction(s): Unknown  . Erythromycin Ethylsuccinate     Other reaction(s): Unknown  . Penicillin V Potassium     Other reaction(s): Unknown  . Penicillins Other (See Comments)    Has patient had a PCN reaction causing immediate rash, facial/tongue/throat swelling, SOB or lightheadedness with hypotension: {yes Has patient had a PCN reaction causing severe rash involving mucus membranes or skin necrosis: no Has patient had a PCN reaction that required hospitalization:no Has patient had a PCN reaction occurring within the last 10 years: :no If all of the above answers are "NO", then may proceed with Cephalosporin  use.   . Sulfa Antibiotics Other (See Comments)  . Sulfamethoxazole-Trimethoprim Other (See Comments)     REVIEW OF SYSTEMS (Negative unless checked)  Constitutional: [] Weight loss  [] Fever  [] Chills Cardiac: [] Chest pain   [] Chest pressure   [] Palpitations   [] Shortness of breath when laying flat   [] Shortness of breath with exertion. Vascular:  [] Pain in legs with walking   [x] Pain in legs with standing  [] History of DVT   [] Phlebitis   [x] Swelling in legs   [] Varicose veins   [] Non-healing ulcers Pulmonary:   [] Uses home oxygen   [] Productive cough   [] Hemoptysis   [] Wheeze  [] COPD   [] Asthma Neurologic:  [] Dizziness   [] Seizures   [] History of stroke   [] History of TIA  [] Aphasia   [] Vissual changes   [] Weakness or numbness in arm   [] Weakness or numbness in leg Musculoskeletal:   [x] Joint swelling   [x] Joint pain   [] Low back pain Hematologic:  [x] Easy bruising  [] Easy bleeding   [] Hypercoagulable state   [] Anemic Gastrointestinal:  [] Diarrhea   [] Vomiting  [] Gastroesophageal reflux/heartburn   [] Difficulty swallowing. Genitourinary:  [] Chronic kidney disease   [] Difficult urination  [] Frequent urination   [] Blood in urine Skin:  [] Rashes   [] Ulcers  Psychological:  [] History of anxiety   []  History of major depression.  Physical Examination  Vitals:   01/15/17 1635  BP: 122/80  Pulse: 84  Resp: 16  Weight: 71.6 kg (157 lb 12.8 oz)   Body mass index is 28.86 kg/m. Gen: WD/WN, NAD Head: [place]/AT, No temporalis wasting.  Ear/Nose/Throat: Hearing grossly intact, nares w/o erythema or drainage Eyes: PER, EOMI, sclera nonicteric.  Neck: Supple, no large masses.   Pulmonary:  Good air movement, no audible wheezing bilaterally, no use of accessory muscles.  Cardiac: RRR, no JVD Vascular: scattered varicosities present extensively greater than 3 mm bilaterally.  Mild venous stasis changes to the legs bilaterally.  2+ soft pitting edema Vessel Right Left  Radial Palpable Palpable   PT Palpable Palpable  DP Palpable Palpable  Gastrointestinal: Non-distended. No guarding/no peritoneal signs.  Musculoskeletal: M/S 5/5 throughout.  No deformity or atrophy.  Neurologic: CN 2-12 intact. Symmetrical.  Speech is fluent. Motor exam as listed above. Psychiatric: Judgment intact, Mood & affect appropriate for pt's clinical situation. Dermatologic: No rashes or ulcers noted.  No changes consistent with cellulitis. Lymph : No lichenification or skin changes of chronic lymphedema.  CBC Lab Results  Component Value Date   WBC 9.5 11/04/2015   HGB 11.6 (L) 11/04/2015   HCT 34.7 (L) 11/04/2015   MCV 92.3 11/04/2015   PLT 210 11/04/2015    BMET    Component Value Date/Time   NA 142 11/04/2015 2352   NA 138 05/01/2012 2210   K 3.1 (L) 11/04/2015 2352   K 3.6 05/01/2012 2210   CL 104 11/04/2015 2352   CL 104 05/01/2012  2210   CO2 30 11/04/2015 2352   CO2 23 05/01/2012 2210   GLUCOSE 113 (H) 11/04/2015 2352   GLUCOSE 209 (H) 05/01/2012 2210   BUN 14 11/04/2015 2352   BUN 15 05/01/2012 2210   CREATININE 0.91 11/04/2015 2352   CREATININE 1.07 05/01/2012 2210   CALCIUM 9.2 11/04/2015 2352   CALCIUM 8.9 05/01/2012 2210   GFRNONAA 54 (L) 11/04/2015 2352   GFRNONAA 47 (L) 05/01/2012 2210   GFRAA >60 11/04/2015 2352   GFRAA 54 (L) 05/01/2012 2210   CrCl cannot be calculated (Patient's most recent lab result is older than the maximum 21 days allowed.).  COAG Lab Results  Component Value Date   INR 2.23 11/04/2015   INR 2.9 05/03/2012   INR 2.1 05/01/2012    Radiology No results found.   Assessment/Plan 1. Lymphedema  No surgery or intervention at this point in time.    I have reviewed my discussion with the patient regarding lymphedema and why it  causes symptoms.  Patient will try wearing graduated compression wraps by [employer] or Sigvaris class 1 (20-30 mmHg) on a daily basis a prescription was given. The patient is reminded to put the stockings on first thing  in the morning and removing them in the evening. The patient is instructed specifically not to sleep in the stockings.   In addition, behavioral modification throughout the day will be continued.  This will include frequent elevation (such as in a recliner), use of over the counter pain medications as needed and exercise such as walking.  I have reviewed systemic causes for chronic edema such as liver, kidney and cardiac etiologies and there does not appear to be any significant changes in these organ systems over the past year.  The patient is under the impression that these organ systems are all stable and unchanged.    The patient will continue aggressive use of the  lymph pump.  This will continue to improve the edema control and prevent sequela such as ulcers and infections.   The patient will follow-up with me on an annual basis.    2. Chronic venous insufficiency No surgery or intervention at this point in time.    I have had a long discussion with the patient regarding venous insufficiency and why it  causes symptoms. I have discussed with the patient the chronic skin changes that accompany venous insufficiency and the long term sequela such as infection and ulceration.  Patient will begin wearing graduated compression wraps class 1 (20-30 mmHg) or compression wraps on a daily basis a prescription was given. The patient will put the stockings on first thing in the morning and removing them in the evening. The patient is instructed specifically not to sleep in the stockings.    In addition, behavioral modification including several periods of elevation of the lower extremities during the day will be continued. I have demonstrated that proper elevation is a position with the ankles at heart level.  The patient is instructed to begin routine exercise, especially walking on a daily basis  The patient continue her Lymph Pump   3. Essential hypertension Continue antihypertensive medications  as already ordered, these medications have been reviewed and there are no changes at this time.   4. Mixed hyperlipidemia Continue statin as ordered and reviewed, no changes at this time   5. Gastroesophageal reflux disease without esophagitis Continue PPI as already ordered, these medications have been reviewed and there are no changes at this time.  Levora Dredge, MD  01/17/2017 12:50 PM

## 2018-01-20 ENCOUNTER — Other Ambulatory Visit: Payer: Self-pay

## 2018-01-20 ENCOUNTER — Encounter: Payer: Self-pay | Admitting: Gynecology

## 2018-01-20 ENCOUNTER — Ambulatory Visit
Admission: EM | Admit: 2018-01-20 | Discharge: 2018-01-20 | Disposition: A | Discharge status: Home / Self Care | Payer: Medicare Other | Attending: Emergency Medicine | Admitting: Emergency Medicine

## 2018-01-20 DIAGNOSIS — M26622 Arthralgia of left temporomandibular joint: Secondary | ICD-10-CM

## 2018-01-20 DIAGNOSIS — K0510 Chronic gingivitis, plaque induced: Secondary | ICD-10-CM | POA: Diagnosis not present

## 2018-01-20 MED ORDER — TRIAMCINOLONE ACETONIDE 0.1 % MT PSTE: 1.0000 "application " | PASTE | Freq: Two times a day (BID) | OROMUCOSAL | 0 refills | Status: DC

## 2018-01-20 NOTE — ED Triage Notes (Signed)
Per patient wear dentures. Patient stated woke up this mornining with gum pain on her left side mouth.

## 2018-01-20 NOTE — ED Provider Notes (Signed)
HPI  SUBJECTIVE:  Gina White is a 84 y.o. female who presents with constant sharp left upper jaw pain this morning and left lower gingival pain states that she was unable to put in her lower dentures today because of the pain.  States that her dentures to fit her well.  She denies gum swelling, bleeding, antral ulcers, fevers.  She reports pain with opening her mouth but has no trismus.  No symptoms like this before.  She tried tramadol without improvement in her symptoms.  Her symptoms are worse when she opens her mouth.  Her pain is not associated with exposure to air, or drinking.  No antibiotics in the past month.  No oral itching, burning.  She is currently on nystatin oral rinse for thrush.  No facial rash.  She has a past medical history of lichen planus which manifest as oral ulcers.  Also hypertension, atrial fibrillation for which she takes warfarin, hypercholesterolemia.  No history of diabetes.  PMD: Lauro Regulus, MD.  Has an appointment with him in 10 days.    Past Medical History:  Diagnosis Date  . Arthritis   . Hypertension     Past Surgical History:  Procedure Laterality Date  . ABDOMINAL HYSTERECTOMY    . CHOLECYSTECTOMY    . JOINT REPLACEMENT      History reviewed. No pertinent family history.  Social History   Tobacco Use  . Smoking status: Never Smoker  . Smokeless tobacco: Current User    Types: Snuff  Substance Use Topics  . Alcohol use: No  . Drug use: Never    No current facility-administered medications for this encounter.   Current Outpatient Medications:  .  acetaminophen (TYLENOL) 500 MG tablet, Take 500 mg by mouth every 6 (six) hours as needed., Disp: , Rfl:  .  aspirin EC 81 MG tablet, Take 81 mg by mouth daily., Disp: , Rfl:  .  atorvastatin (LIPITOR) 20 MG tablet, Take 20 mg by mouth daily at 6 PM., Disp: , Rfl:  .  bisacodyl (DULCOLAX) 5 MG EC tablet, Take 5 mg by mouth daily as needed for moderate constipation., Disp: , Rfl:   .  Calcium Carbonate-Vitamin D (CALCIUM 600+D) 600-400 MG-UNIT tablet, Take 2 tablets by mouth daily., Disp: , Rfl:  .  cetirizine (ZYRTEC) 10 MG tablet, Take 10 mg by mouth daily., Disp: , Rfl:  .  dexamethasone 0.5 MG/5ML elixir, Take 1 mg by mouth 3 (three) times daily., Disp: , Rfl:  .  diazepam (VALIUM) 5 MG tablet, Take 5 mg by mouth at bedtime as needed for anxiety., Disp: , Rfl:  .  diphenhydrAMINE (SOMINEX) 25 MG tablet, Take 25 mg by mouth at bedtime as needed for sleep., Disp: , Rfl:  .  EPINEPHrine 0.3 mg/0.3 mL IJ SOAJ injection, Inject 0.3 mg into the muscle once., Disp: , Rfl:  .  fluticasone (FLONASE) 50 MCG/ACT nasal spray, Place 2 sprays into both nostrils daily., Disp: , Rfl:  .  furosemide (LASIX) 20 MG tablet, Take 40 mg by mouth daily., Disp: , Rfl:  .  glucosamine-chondroitin 500-400 MG tablet, Take 1 tablet by mouth 2 (two) times daily., Disp: , Rfl:  .  metaxalone (SKELAXIN) 800 MG tablet, Take 800 mg by mouth 3 (three) times daily., Disp: , Rfl:  .  metoprolol succinate (TOPROL-XL) 50 MG 24 hr tablet, Take 50 mg by mouth 2 (two) times daily. Take with or immediately following a meal., Disp: , Rfl:  .  Multiple Vitamins-Minerals (MULTIVITAMIN WITH MINERALS) tablet, Take 1 tablet by mouth daily., Disp: , Rfl:  .  potassium chloride (MICRO-K) 10 MEQ CR capsule, Take 10 mEq by mouth daily., Disp: , Rfl:  .  sennosides-docusate sodium (SENOKOT-S) 8.6-50 MG tablet, Take 1 tablet by mouth daily., Disp: , Rfl:  .  traMADol (ULTRAM) 50 MG tablet, Take by mouth every 6 (six) hours as needed., Disp: , Rfl:  .  warfarin (COUMADIN) 5 MG tablet, Take 5 mg by mouth daily., Disp: , Rfl:  .  gabapentin (NEURONTIN) 300 MG capsule, Take 300-600 mg by mouth at bedtime. , Disp: , Rfl:  .  triamcinolone (KENALOG) 0.1 % paste, Use as directed 1 application in the mouth or throat 2 (two) times daily., Disp: 5 g, Rfl: 0  Allergies  Allergen Reactions  . Azithromycin     Other reaction(s):  Unknown  . Erythromycin Ethylsuccinate     Other reaction(s): Unknown  . Penicillin V Potassium     Other reaction(s): Unknown  . Penicillins Other (See Comments)    Has patient had a PCN reaction causing immediate rash, facial/tongue/throat swelling, SOB or lightheadedness with hypotension: {yes Has patient had a PCN reaction causing severe rash involving mucus membranes or skin necrosis: no Has patient had a PCN reaction that required hospitalization:no Has patient had a PCN reaction occurring within the last 10 years: :no If all of the above answers are "NO", then may proceed with Cephalosporin use.   . Sulfa Antibiotics Other (See Comments)  . Sulfamethoxazole-Trimethoprim Other (See Comments)     ROS  As noted in HPI.   Physical Exam  BP (!) 158/86 (BP Location: Left Arm)   Pulse 80   Temp 98.1 F (36.7 C) (Oral)   Wt 155 lb (70.3 kg)   SpO2 98%   BMI 28.35 kg/m   Constitutional: Well developed, well nourished, no acute distress Eyes:  EOMI, conjunctiva normal bilaterally HENT: Normocephalic, atraumatic,mucus membranes moist.  Edentulous.  Positive gingival tenderness lower gum.  Mucosa seems irritated.  No gingival swelling, purulent drainage, evidence of abscess.  No tenderness along the upper gum.  No oral ulcers noted anywhere.  Left external ear, external ear canal normal.  Positive tenderness at the left TMJ.  No crepitus.  No trismus. Respiratory: Normal inspiratory effort Cardiovascular: Normal rate GI: nondistended skin: No facial rash, skin intact Musculoskeletal: no deformities Neurologic: Alert & oriented x 3, no focal neuro deficits Psychiatric: Speech and behavior appropriate   ED Course   Medications - No data to display  No orders of the defined types were placed in this encounter.   No results found for this or any previous visit (from the past 24 hour(s)). No results found.  ED Clinical Impression  Gingivitis  Arthralgia of left  temporomandibular joint   ED Assessment/Plan  Patient with TMJ tenderness, arthralgia.  No evidence of shingles or external otitis, cellulitis of the external ear canal.  We will treat this with Tylenol 500 mg 3 or 4 times a day.  No Flexeril due to fall risk.  No NSAIDs because of the warfarin.  She also has exquisite gum tenderness on the lower gums.  The mucosa appears very irritated.  There are no intraoral ulcers or any signs of infection.  She is already on nystatin rinse for oral thrush.  We will try some triamcinolone dental paste for the inflammation.  She is to follow-up with her primary care physician if not better in several days.  Discussed MDM, treatment plan, and plan for follow-up with patient. . patient agrees with plan.   Meds ordered this encounter  Medications  . triamcinolone (KENALOG) 0.1 % paste    Sig: Use as directed 1 application in the mouth or throat 2 (two) times daily.    Dispense:  5 g    Refill:  0    *This clinic note was created using Scient[employer], clinical (histocompatibility and immunogenetics)Dragon dictation software. Therefore, there may be occasional mistakes despite careful proofreading.   ?   Domenick GongMortenson, Sherlene Rickel, MD 01/20/18 [phone]

## 2018-01-20 NOTE — Discharge Instructions (Addendum)
May try salt water rinses in addition to continuing the nystatin oral rinse.  Tylenol 500 mg 3 or 4 times a day as needed for pain.  Try the triamcinolone dental paste.

## 2018-01-21 ENCOUNTER — Ambulatory Visit (INDEPENDENT_AMBULATORY_CARE_PROVIDER_SITE_OTHER): Payer: Medicare Other | Admitting: Vascular Surgery

## 2018-01-24 ENCOUNTER — Encounter (INDEPENDENT_AMBULATORY_CARE_PROVIDER_SITE_OTHER): Payer: Self-pay | Admitting: Vascular Surgery

## 2018-01-24 ENCOUNTER — Telehealth (INDEPENDENT_AMBULATORY_CARE_PROVIDER_SITE_OTHER): Payer: Self-pay | Admitting: Vascular Surgery

## 2018-01-24 ENCOUNTER — Ambulatory Visit (INDEPENDENT_AMBULATORY_CARE_PROVIDER_SITE_OTHER): Payer: Medicare Other | Admitting: Vascular Surgery

## 2018-01-24 VITALS — BP 128/73 | HR 86 | Resp 16 | Ht 62.0 in | Wt 157.0 lb

## 2018-01-24 DIAGNOSIS — I890 Lymphedema, not elsewhere classified: Secondary | ICD-10-CM

## 2018-01-24 DIAGNOSIS — I872 Venous insufficiency (chronic) (peripheral): Secondary | ICD-10-CM

## 2018-01-24 NOTE — Progress Notes (Signed)
Subjective:    Patient ID: Gina White, female    DOB: [birth year], 84 y.o.   MRN: [id number] Chief Complaint  Patient presents with  . Follow-up    40yr follow up   Patient presents for a yearly chronic venous insufficiency/lymphedema follow-up.  Patient seen with her daughter.  The patient presents today without complaint.  The patient has been engaging in conservative therapy including wearing medical grade 1 compression socks, elevating her legs and using her lymphedema pump twice a day for an hour each time.  The patient feels that this is controlling her bilateral lower extremity edema and discomfort.  She presents today denying any claudication-like symptoms, rest pain or ulcer formation to the bilateral legs.  Patient denies any recent bouts of cellulitis.  Patient denies any fever, nausea or vomiting.  Review of Systems  Constitutional: Negative.   HENT: Negative.   Eyes: Negative.   Respiratory: Negative.   Cardiovascular: Negative.   Gastrointestinal: Negative.   Endocrine: Negative.   Genitourinary: Negative.   Musculoskeletal: Negative.   Skin: Negative.   Allergic/Immunologic: Negative.   Neurological: Negative.   Hematological: Negative.   Psychiatric/Behavioral: Negative.       Objective:   Physical Exam  Constitutional: She is oriented to person, place, and time. She appears well-developed and well-nourished. No distress.  HENT:  Head: Normocephalic and atraumatic.  Right Ear: External ear normal.  Left Ear: External ear normal.  Eyes: Pupils are equal, round, and reactive to light. Conjunctivae and EOM are normal.  Neck: Normal range of motion.  Cardiovascular: Normal rate, regular rhythm, normal heart sounds and intact distal pulses.  Pulses:      Radial pulses are 2+ on the right side, and 2+ on the left side.       Dorsalis pedis pulses are 1+ on the right side, and 1+ on the left side.       Posterior tibial pulses are 1+ on the right side, and 1+  on the left side.  Pulmonary/Chest: Effort normal and breath sounds normal.  Musculoskeletal: Normal range of motion. She exhibits edema (Mild nonpitting lower extremity edema bilaterally.).  Neurological: She is alert and oriented to person, place, and time.  Skin: Skin is warm and dry. She is not diaphoretic.  Psychiatric: She has a normal mood and affect. Her behavior is normal. Judgment and thought content normal.  Vitals reviewed.  BP 128/73 (BP Location: Right Arm)   Pulse 86   Resp 16   Ht 5\' 2"  (1.575 m)   Wt 157 lb (71.2 kg)   BMI 28.72 kg/m   Past Medical History:  Diagnosis Date  . Arthritis   . Hypertension    Social History   Socioeconomic History  . Marital status: Widowed    Spouse name: Not on file  . Number of children: Not on file  . Years of education: Not on file  . Highest education level: Not on file  Occupational History  . Not on file  Social Needs  . Financial resource strain: Not on file  . Food insecurity:    Worry: Not on file    Inability: Not on file  . Transportation needs:    Medical: Not on file    Non-medical: Not on file  Tobacco Use  . Smoking status: Never Smoker  . Smokeless tobacco: Current User    Types: Snuff  Substance and Sexual Activity  . Alcohol use: No  . Drug use: Never  . Sexual  activity: Not on file  Lifestyle  . Physical activity:    Days per week: Not on file    Minutes per session: Not on file  . Stress: Not on file  Relationships  . Social connections:    Talks on phone: Not on file    Gets together: Not on file    Attends religious service: Not on file    Active member of club or organization: Not on file    Attends meetings of clubs or organizations: Not on file    Relationship status: Not on file  . Intimate partner violence:    Fear of current or ex partner: Not on file    Emotionally abused: Not on file    Physically abused: Not on file    Forced sexual activity: Not on file  Other Topics  Concern  . Not on file  Social History Narrative  . Not on file   Past Surgical History:  Procedure Laterality Date  . ABDOMINAL HYSTERECTOMY    . CHOLECYSTECTOMY    . JOINT REPLACEMENT     No family history on file.  Allergies  Allergen Reactions  . Azithromycin     Other reaction(s): Unknown  . Erythromycin Ethylsuccinate     Other reaction(s): Unknown  . Penicillin V Potassium     Other reaction(s): Unknown  . Penicillins Other (See Comments)    Has patient had a PCN reaction causing immediate rash, facial/tongue/throat swelling, SOB or lightheadedness with hypotension: {yes Has patient had a PCN reaction causing severe rash involving mucus membranes or skin necrosis: no Has patient had a PCN reaction that required hospitalization:no Has patient had a PCN reaction occurring within the last 10 years: :no If all of the above answers are "NO", then may proceed with Cephalosporin use.   . Sulfa Antibiotics Other (See Comments)  . Sulfamethoxazole-Trimethoprim Other (See Comments)      Assessment & Plan:  Patient presents for a yearly chronic venous insufficiency/lymphedema follow-up.  Patient seen with her daughter.  The patient presents today without complaint.  The patient has been engaging in conservative therapy including wearing medical grade 1 compression socks, elevating her legs and using her lymphedema pump twice a day for an hour each time.  The patient feels that this is controlling her bilateral lower extremity edema and discomfort.  She presents today denying any claudication-like symptoms, rest pain or ulcer formation to the bilateral legs.  Patient denies any recent bouts of cellulitis.  Patient denies any fever, nausea or vomiting.  1. Lymphedema - Stable Patient presents for a yearly chronic venous insufficiency/lymphedema follow-up. The patient has been engaging in conservative therapy including wearing medical grade 1 compression, elevating her legs and using  her lymphedema pump at least twice a day for an hour each time. The patient notes that this seems to control her symptoms and she is pleased with her results. The patient should continue her daily routine The patient should follow-up with [place] as an as-needed basis. The patient was instructed to call the office in the interim if any worsening edema or ulcerations to the legs, feet or toes occurs. The patient expresses their understanding.  2. Chronic venous insufficiency - Stable As above  Current Outpatient Medications on File Prior to Visit  Medication Sig Dispense Refill  . acetaminophen (TYLENOL) 500 MG tablet Take 500 mg by mouth every 6 (six) hours as needed.    Marland Kitchen aspirin EC 81 MG tablet Take 81 mg by mouth daily.    Marland Kitchen  atorvastatin (LIPITOR) 20 MG tablet Take 20 mg by mouth daily at 6 PM.    . bisacodyl (DULCOLAX) 5 MG EC tablet Take 5 mg by mouth daily as needed for moderate constipation.    . Calcium Carbonate-Vitamin D (CALCIUM 600+D) 600-400 MG-UNIT tablet Take 2 tablets by mouth daily.    . cetirizine (ZYRTEC) 10 MG tablet Take 10 mg by mouth daily.    Marland Kitchen. dexamethasone 0.5 MG/5ML elixir Take 1 mg by mouth 3 (three) times daily.    . diazepam (VALIUM) 5 MG tablet Take 5 mg by mouth at bedtime as needed for anxiety.    . diphenhydrAMINE (SOMINEX) 25 MG tablet Take 25 mg by mouth at bedtime as needed for sleep.    Marland Kitchen. EPINEPHrine 0.3 mg/0.3 mL IJ SOAJ injection Inject 0.3 mg into the muscle once.    . fluticasone (FLONASE) 50 MCG/ACT nasal spray Place 2 sprays into both nostrils daily.    . furosemide (LASIX) 20 MG tablet Take 40 mg by mouth daily.    Marland Kitchen. gabapentin (NEURONTIN) 300 MG capsule Take 300-600 mg by mouth at bedtime.     Marland Kitchen. glucosamine-chondroitin 500-400 MG tablet Take 1 tablet by mouth 2 (two) times daily.    . metaxalone (SKELAXIN) 800 MG tablet Take 800 mg by mouth 3 (three) times daily.    . metoprolol succinate (TOPROL-XL) 50 MG 24 hr tablet Take 50 mg by mouth 2 (two)  times daily. Take with or immediately following a meal.    . Multiple Vitamins-Minerals (MULTIVITAMIN WITH MINERALS) tablet Take 1 tablet by mouth daily.    . potassium chloride (MICRO-K) 10 MEQ CR capsule Take 10 mEq by mouth daily.    . sennosides-docusate sodium (SENOKOT-S) 8.6-50 MG tablet Take 1 tablet by mouth daily.    . traMADol (ULTRAM) 50 MG tablet Take by mouth every 6 (six) hours as needed.    . triamcinolone (KENALOG) 0.1 % paste Use as directed 1 application in the mouth or throat 2 (two) times daily. 5 g 0  . warfarin (COUMADIN) 5 MG tablet Take 5 mg by mouth daily.     No current facility-administered medications on file prior to visit.    There are no Patient Instructions on file for this visit. No follow-ups on file.  Juliona Vales A Ranessa Kosta, PA-C

## 2019-03-25 ENCOUNTER — Other Ambulatory Visit: Payer: Self-pay | Admitting: Physician Assistant

## 2019-03-25 ENCOUNTER — Other Ambulatory Visit: Payer: Self-pay

## 2019-03-25 ENCOUNTER — Ambulatory Visit
Admission: RE | Admit: 2019-03-25 | Discharge: 2019-03-25 | Disposition: A | Discharge status: Home / Self Care | Payer: Medicare Other | Source: Ambulatory Visit | Attending: Physician Assistant | Admitting: Physician Assistant

## 2019-03-25 DIAGNOSIS — R42 Dizziness and giddiness: Secondary | ICD-10-CM

## 2020-02-29 ENCOUNTER — Other Ambulatory Visit: Payer: Self-pay

## 2020-02-29 ENCOUNTER — Emergency Department
Admission: EM | Admit: 2020-02-29 | Discharge: 2020-02-29 | Disposition: A | Discharge status: Home / Self Care | Payer: Medicare PPO | Attending: Emergency Medicine | Admitting: Emergency Medicine

## 2020-02-29 DIAGNOSIS — E860 Dehydration: Secondary | ICD-10-CM | POA: Insufficient documentation

## 2020-02-29 DIAGNOSIS — Z20822 Contact with and (suspected) exposure to covid-19: Secondary | ICD-10-CM | POA: Insufficient documentation

## 2020-02-29 DIAGNOSIS — Z7901 Long term (current) use of anticoagulants: Secondary | ICD-10-CM | POA: Insufficient documentation

## 2020-02-29 DIAGNOSIS — Z79899 Other long term (current) drug therapy: Secondary | ICD-10-CM | POA: Insufficient documentation

## 2020-02-29 DIAGNOSIS — Z96698 Presence of other orthopedic joint implants: Secondary | ICD-10-CM | POA: Insufficient documentation

## 2020-02-29 DIAGNOSIS — A084 Viral intestinal infection, unspecified: Secondary | ICD-10-CM | POA: Diagnosis not present

## 2020-02-29 DIAGNOSIS — I10 Essential (primary) hypertension: Secondary | ICD-10-CM | POA: Diagnosis not present

## 2020-02-29 DIAGNOSIS — Z7982 Long term (current) use of aspirin: Secondary | ICD-10-CM | POA: Insufficient documentation

## 2020-02-29 DIAGNOSIS — R531 Weakness: Secondary | ICD-10-CM | POA: Diagnosis present

## 2020-02-29 LAB — URINALYSIS, COMPLETE (UACMP) WITH MICROSCOPIC
Bacteria, UA: NONE SEEN
Bilirubin Urine: NEGATIVE
Glucose, UA: NEGATIVE mg/dL
Hgb urine dipstick: NEGATIVE
Ketones, ur: NEGATIVE mg/dL
Leukocytes,Ua: NEGATIVE
Nitrite: NEGATIVE
Protein, ur: NEGATIVE mg/dL
Specific Gravity, Urine: 1.002 — ABNORMAL LOW (ref 1.005–1.030)
Squamous Epithelial / LPF: NONE SEEN (ref 0–5)
pH: 7 (ref 5.0–8.0)

## 2020-02-29 LAB — CBC
HCT: 40 % (ref 36.0–46.0)
Hemoglobin: 13.6 g/dL (ref 12.0–15.0)
MCH: 32.4 pg (ref 26.0–34.0)
MCHC: 34 g/dL (ref 30.0–36.0)
MCV: 95.2 fL (ref 80.0–100.0)
Platelets: 272 10*3/uL (ref 150–400)
RBC: 4.2 MIL/uL (ref 3.87–5.11)
RDW: 13.1 % (ref 11.5–15.5)
WBC: 11.5 10*3/uL — ABNORMAL HIGH (ref 4.0–10.5)
nRBC: 0 % (ref 0.0–0.2)

## 2020-02-29 LAB — BASIC METABOLIC PANEL
Anion gap: 13 (ref 5–15)
BUN: 11 mg/dL (ref 8–23)
CO2: 28 mmol/L (ref 22–32)
Calcium: 9.2 mg/dL (ref 8.9–10.3)
Chloride: 93 mmol/L — ABNORMAL LOW (ref 98–111)
Creatinine, Ser: 0.95 mg/dL (ref 0.44–1.00)
GFR calc Af Amer: 59 mL/min — ABNORMAL LOW (ref >60)
GFR calc non Af Amer: 51 mL/min — ABNORMAL LOW (ref >60)
Glucose, Bld: 124 mg/dL — ABNORMAL HIGH (ref 70–99)
Potassium: 3.2 mmol/L — ABNORMAL LOW (ref 3.5–5.1)
Sodium: 134 mmol/L — ABNORMAL LOW (ref 135–145)

## 2020-02-29 LAB — HEPATIC FUNCTION PANEL
ALT: 16 U/L (ref 0–44)
AST: 27 U/L (ref 15–41)
Albumin: 3.9 g/dL (ref 3.5–5.0)
Alkaline Phosphatase: 51 U/L (ref 38–126)
Bilirubin, Direct: 0.2 mg/dL (ref 0.0–0.2)
Indirect Bilirubin: 0.9 mg/dL (ref 0.3–0.9)
Total Bilirubin: 1.1 mg/dL (ref 0.3–1.2)
Total Protein: 6.6 g/dL (ref 6.5–8.1)

## 2020-02-29 LAB — PROTIME-INR
INR: 2.8 — ABNORMAL HIGH (ref 0.8–1.2)
Prothrombin Time: 28.5 seconds — ABNORMAL HIGH (ref 11.4–15.2)

## 2020-02-29 LAB — TROPONIN I (HIGH SENSITIVITY)
Troponin I (High Sensitivity): 11 ng/L (ref <18)
Troponin I (High Sensitivity): 9 ng/L (ref <18)

## 2020-02-29 LAB — SARS CORONAVIRUS 2 BY RT PCR (HOSPITAL ORDER, PERFORMED IN ~~LOC~~ HOSPITAL LAB): SARS Coronavirus 2: NEGATIVE

## 2020-02-29 LAB — LIPASE, BLOOD: Lipase: 23 U/L (ref 11–51)

## 2020-02-29 MED ORDER — ONDANSETRON HCL 4 MG/2ML IJ SOLN
4.0000 mg | Freq: Once | INTRAMUSCULAR | Status: AC
  Administered 2020-02-29: 4 mg via INTRAVENOUS
  Filled 2020-02-29: qty 2

## 2020-02-29 MED ORDER — LOPERAMIDE HCL 2 MG PO TABS: 4.0000 mg | ORAL_TABLET | Freq: Four times a day (QID) | ORAL | 0 refills | Status: DC | PRN

## 2020-02-29 MED ORDER — ONDANSETRON 4 MG PO TBDP: 4.0000 mg | ORAL_TABLET | Freq: Three times a day (TID) | ORAL | 0 refills | Status: DC | PRN

## 2020-02-29 MED ORDER — ONDANSETRON 4 MG PO TBDP: 4.0000 mg | ORAL_TABLET | Freq: Once | ORAL | Status: DC | PRN

## 2020-02-29 MED ORDER — FAMOTIDINE IN NACL 20-0.9 MG/50ML-% IV SOLN
20.0000 mg | Freq: Once | INTRAVENOUS | Status: AC
  Administered 2020-02-29: 20 mg via INTRAVENOUS
  Filled 2020-02-29: qty 50

## 2020-02-29 MED ORDER — LACTATED RINGERS IV BOLUS
1000.0000 mL | Freq: Once | INTRAVENOUS | Status: AC
  Administered 2020-02-29: 1000 mL via INTRAVENOUS

## 2020-02-29 MED ORDER — FAMOTIDINE 20 MG PO TABS: 20.0000 mg | ORAL_TABLET | Freq: Two times a day (BID) | ORAL | 0 refills | Status: DC

## 2020-02-29 NOTE — ED Notes (Signed)
E-sign not working. Work order placed. Paper copy of signature on chart

## 2020-02-29 NOTE — ED Notes (Signed)
Pt arrived to ED stating "not feeling good" for about 10 days and weak, nauseous last 2 nights, with 2 other nights this past week that she had vomiting. Last Clovis Cao 8/12 pt tested negative for covid after experiencing SOB, cough, sore throat for a few days. She was prescribed prednisone 20mg  tab once daily for 5 days and doxycycline 100mg  BID for 7 days. Around the time these meds were finished, pt started having nausea, malaise and intermittent vomiting (2-3 times 2 different nights, small amount, not bloody. Currently no SOB, chest pain just generalized weakness.

## 2020-02-29 NOTE — ED Notes (Signed)
Pt alert and oriented times 4. Pt at ease, resting in bed, pain 0/10, lungs clear, family at bedside.

## 2020-02-29 NOTE — ED Notes (Signed)
Gave ice chips per pt request.

## 2020-02-29 NOTE — ED Provider Notes (Signed)
[facility] Emergency Department Provider Note  ____________________________________________  Time seen: Approximately 5:18 PM  I have reviewed the triage vital signs and the nursing notes.   HISTORY  Chief Complaint Weakness    HPI Gina White is a 86 y.o. female who complains of chills, generalized weakness, decreased appetite, nausea vomiting and diarrhea over the past week.  She went to primary care who obtained a negative rapid antigen Covid test, gave her a course of prednisone and doxycycline which she has finished.  She has had persistent GI symptoms and poor oral intake, still feeling fatigued and worried about dehydration.  Symptoms are constant, waxing and waning, no aggravating or alleviating factors.  Denies chest pain shortness of breath or cough.  Denies myalgias, no falls or trauma.  She had 2 Covid vaccines in March 2021.      Past Medical History:  Diagnosis Date  . Arthritis   . Hypertension      Patient Active Problem List   Diagnosis Date Noted  . Lymphedema 01/17/2017  . Chronic venous insufficiency 01/17/2017  . Essential hypertension 01/17/2017  . Hyperlipidemia 01/17/2017  . GERD (gastroesophageal reflux disease) 01/17/2017     Past Surgical History:  Procedure Laterality Date  . ABDOMINAL HYSTERECTOMY    . CHOLECYSTECTOMY    . JOINT REPLACEMENT       Prior to Admission medications   Medication Sig Start Date End Date Taking? Authorizing Provider  acetaminophen (TYLENOL) 500 MG tablet Take 500 mg by mouth every 6 (six) hours as needed.    [provider]  aspirin EC 81 MG tablet Take 81 mg by mouth daily.    [provider]  atorvastatin (LIPITOR) 20 MG tablet Take 20 mg by mouth daily at 6 PM.    [provider]  bisacodyl (DULCOLAX) 5 MG EC tablet Take 5 mg by mouth daily as needed for moderate constipation.    [provider]  Calcium Carbonate-Vitamin D (CALCIUM 600+D)  600-400 MG-UNIT tablet Take 2 tablets by mouth daily.    [provider]  cetirizine (ZYRTEC) 10 MG tablet Take 10 mg by mouth daily.    [provider]  dexamethasone 0.5 MG/5ML elixir Take 1 mg by mouth 3 (three) times daily.    [provider]  diazepam (VALIUM) 5 MG tablet Take 5 mg by mouth at bedtime as needed for anxiety.    [provider]  diphenhydrAMINE (SOMINEX) 25 MG tablet Take 25 mg by mouth at bedtime as needed for sleep.    [provider]  EPINEPHrine 0.3 mg/0.3 mL IJ SOAJ injection Inject 0.3 mg into the muscle once.    [provider]  famotidine (PEPCID) 20 MG tablet Take 1 tablet (20 mg total) by mouth 2 (two) times daily. 02/29/20   Sharman Cheek, MD  fluticasone [facility]) 50 MCG/ACT nasal spray Place 2 sprays into both nostrils daily.    [provider]  furosemide (LASIX) 20 MG tablet Take 40 mg by mouth daily.    [provider]  gabapentin (NEURONTIN) 300 MG capsule Take 300-600 mg by mouth at bedtime.     [provider]  glucosamine-chondroitin 500-400 MG tablet Take 1 tablet by mouth 2 (two) times daily.    [provider]  loperamide (IMODIUM A-D) 2 MG tablet Take 2 tablets (4 mg total) by mouth 4 (four) times daily as needed for diarrhea or loose stools. 02/29/20   Sharman Cheek, MD  metaxalone [facility]) 800  MG tablet Take 800 mg by mouth 3 (three) times daily.    [provider]  metoprolol succinate (TOPROL-XL) 50 MG 24 hr tablet Take 50 mg by mouth 2 (two) times daily. Take with or immediately following a meal.    [provider]  Multiple Vitamins-Minerals (MULTIVITAMIN WITH MINERALS) tablet Take 1 tablet by mouth daily.    [provider]  ondansetron (ZOFRAN ODT) 4 MG disintegrating tablet Take 1 tablet (4 mg total) by mouth every 8 (eight) hours as needed for nausea or vomiting. 02/29/20   Sharman Cheek, MD  potassium chloride (MICRO-K)  10 MEQ CR capsule Take 10 mEq by mouth daily.    [provider]  sennosides-docusate sodium (SENOKOT-S) 8.6-50 MG tablet Take 1 tablet by mouth daily.    [provider]  traMADol (ULTRAM) 50 MG tablet Take by mouth every 6 (six) hours as needed.    [provider]  triamcinolone (KENALOG) 0.1 % paste Use as directed 1 application in the mouth or throat 2 (two) times daily. 01/20/18   Domenick Gong, MD  warfarin (COUMADIN) 5 MG tablet Take 5 mg by mouth daily.    [provider]     Allergies Azithromycin, Erythromycin ethylsuccinate, Penicillin v potassium, Penicillins, Sulfa antibiotics, and Sulfamethoxazole-trimethoprim   History reviewed. No pertinent family history.  Social History Social History   Tobacco Use  . Smoking status: Never Smoker  . Smokeless tobacco: Current User    Types: Snuff  Substance Use Topics  . Alcohol use: No  . Drug use: Never    Review of Systems  Constitutional:   Positive chills.  ENT:   No sore throat. No rhinorrhea. Cardiovascular:   No chest pain or syncope. Respiratory:   No dyspnea or cough. Gastrointestinal:   Negative for abdominal pain, positive vomiting and diarrhea.  Musculoskeletal:   Negative for focal pain or swelling All other systems reviewed and are negative except as documented above in ROS and HPI.  ____________________________________________   PHYSICAL EXAM:  VITAL SIGNS: ED Triage Vitals [02/29/20 1223]  Enc Vitals Group     BP (!) 158/88     Pulse Rate 97     Resp 18     Temp 98.2 F (36.8 C)     Temp Source Oral     SpO2 99 %     Weight 156 lb 8.4 oz (71 kg)     Height 5\' 2"  (1.575 m)     Head Circumference      Peak Flow      Pain Score 4     Pain Loc      Pain Edu?      Excl. in GC?     Vital signs reviewed, nursing assessments reviewed.   Constitutional:   Alert and oriented. Non-toxic appearance. Eyes:   Conjunctivae are normal. EOMI. PERRL. ENT       Head:   Normocephalic and atraumatic.      Nose:   Normal      Mouth/Throat:   Normal, moist mucosa, no intraoral swelling.      Neck:   No meningismus. Full ROM. Hematological/Lymphatic/Immunilogical:   No cervical lymphadenopathy. Cardiovascular:   RRR. Symmetric bilateral radial and DP pulses.  No murmurs. Cap refill less than 2 seconds. Respiratory:   Normal respiratory effort without tachypnea/retractions. Breath sounds are clear and equal bilaterally. No wheezes/rales/rhonchi. Gastrointestinal:   Soft and nontender. Non distended. There is no CVA tenderness.  No rebound, rigidity, or guarding.  Musculoskeletal:   Normal range of motion in all extremities. No joint effusions.  No lower extremity tenderness.  No edema. Neurologic:   Normal speech and language.  Motor grossly intact. No acute focal neurologic deficits are appreciated.  Skin:    Skin is warm, dry and intact. No rash noted.  No petechiae, purpura, or bullae.  ____________________________________________    LABS (pertinent positives/negatives) (all labs ordered are listed, but only abnormal results are displayed) Labs Reviewed  BASIC METABOLIC PANEL - Abnormal; Notable for the following components:      Result Value   Sodium 134 (*)    Potassium 3.2 (*)    Chloride 93 (*)    Glucose, Bld 124 (*)    GFR calc non Af Amer 51 (*)    GFR calc Af Amer 59 (*)    All other components within normal limits  CBC - Abnormal; Notable for the following components:   WBC 11.5 (*)    All other components within normal limits  URINALYSIS, COMPLETE (UACMP) WITH MICROSCOPIC - Abnormal; Notable for the following components:   Color, Urine COLORLESS (*)    APPearance CLEAR (*)    Specific Gravity, Urine 1.002 (*)    All other components within normal limits  PROTIME-INR - Abnormal; Notable for the following components:   Prothrombin Time 28.5 (*)    INR 2.8 (*)    All other components within normal limits  SARS CORONAVIRUS 2 BY  RT PCR (HOSPITAL ORDER, PERFORMED IN  HOSPITAL LAB)  LIPASE, BLOOD  HEPATIC FUNCTION PANEL  CBG MONITORING, ED  TROPONIN I (HIGH SENSITIVITY)  TROPONIN I (HIGH SENSITIVITY)   ____________________________________________   EKG    ____________________________________________    RADIOLOGY  No results found.  ____________________________________________   PROCEDURES Procedures  ____________________________________________  DIFFERENTIAL DIAGNOSIS   Dehydration, electrolyte abnormality, viral syndrome, COVID-19  CLINICAL IMPRESSION / ASSESSMENT AND PLAN / ED COURSE  Medications ordered in the ED: Medications  ondansetron (ZOFRAN-ODT) disintegrating tablet 4 mg (has no administration in time range)  lactated ringers bolus 1,000 mL (0 mLs Intravenous Stopped 02/29/20 1956)  ondansetron (ZOFRAN) injection 4 mg (4 mg Intravenous Given 02/29/20 1828)  famotidine (PEPCID) IVPB 20 mg premix (0 mg Intravenous Stopped 02/29/20 1855)    Pertinent labs & imaging results that were available during my care of the patient were reviewed by me and considered in my medical decision making (see chart for details).  EVANIA LYNE was evaluated in Emergency Department on 02/29/2020 for the symptoms described in the history of present illness. She was evaluated in the context of the global COVID-19 pandemic, which necessitated consideration that the patient might be at risk for infection with the SARS-CoV-2 virus that causes COVID-19. Institutional protocols and algorithms that pertain to the evaluation of patients at risk for COVID-19 are in a state of rapid change based on information released by regulatory bodies including the CDC and federal and state organizations. These policies and algorithms were followed during the patient's care in the ED.   Patient presents with multiple constitutional symptoms as well as vomiting and diarrhea.  Vital signs are normal, exam is reassuring,  initial labs are unremarkable.  I will add on LFTs and a Covid PCR.  Suspect some degree of dehydration as well, will give Zofran and Pepcid, IV fluid bolus, reassess.  Clinical Course as of Mar 01 2055  Wynelle Link Feb 29, 2020  2052 Patient is feeling better, tolerating oral liquids.  Labs are reassuring.  I will provide prescriptions for symptomatic management, recommend close follow-up with PCP, focus on fluids and maintaining hydration.   [PS]    Clinical Course User Index [PS] Sharman CheekStafford, Miyeko Mahlum, MD     ____________________________________________   FINAL CLINICAL IMPRESSION(S) / ED DIAGNOSES    Final diagnoses:  Viral gastroenteritis  Dehydration     ED Discharge Orders         Ordered    famotidine (PEPCID) 20 MG tablet  2 times daily        02/29/20 2055    ondansetron (ZOFRAN ODT) 4 MG disintegrating tablet  Every 8 hours PRN        02/29/20 2055    loperamide (IMODIUM A-D) 2 MG tablet  4 times daily PRN        02/29/20 2055          Portions of this note were generated with dragon dictation software. Dictation errors may occur despite best attempts at proofreading.   Sharman CheekStafford, Scarlette Hogston, MD 02/29/20 2056

## 2020-02-29 NOTE — ED Notes (Signed)
Cardiac rhythm irregular, pt states has chronic atrial fibrillation.

## 2020-02-29 NOTE — ED Triage Notes (Signed)
Pt comes POV with weakness, n/v/d after negative covid test last week. Family concerned about dehydration. Pt AOx4.

## 2021-08-31 IMAGING — CT CT HEAD W/O CM
3 series · 14 of 46 positions shown, 16 images · non-contrast
Comparison: None.

CLINICAL DATA: Dizziness for 2 days

EXAM:
CT HEAD WITHOUT CONTRAST
TECHNIQUE: Contiguous axial images were obtained from the base of the skull
through the vertex without intravenous contrast.

[Series 2: head wo · axial · 0.40mm/px · z∈[+495,+615]mm · 8 of 29 slices shown, 10 images]
[im 3/29  brain]
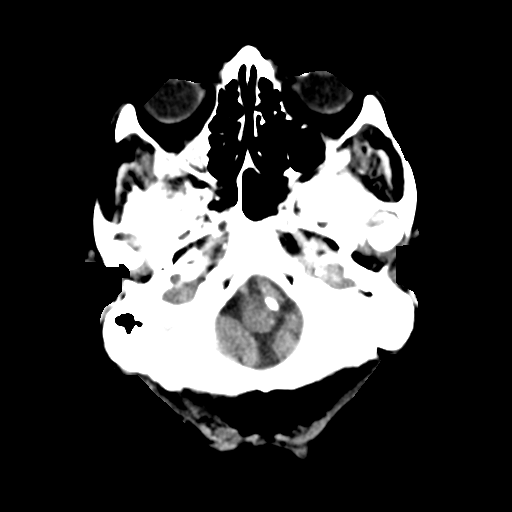
[im 3/29  bone]
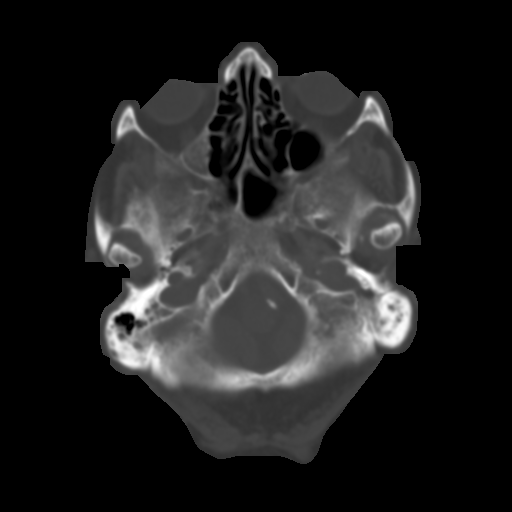
[im 7/29  brain]
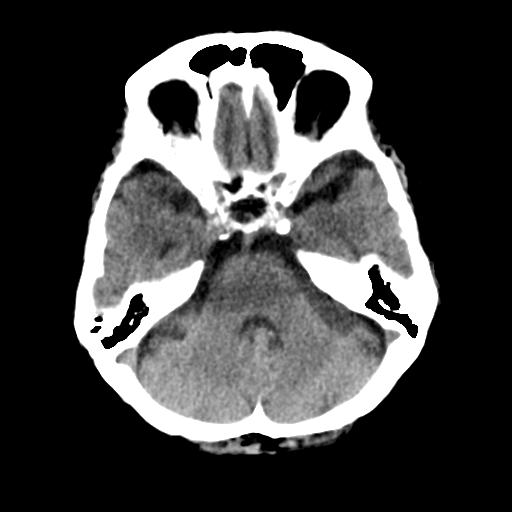
[im 10/29  brain]
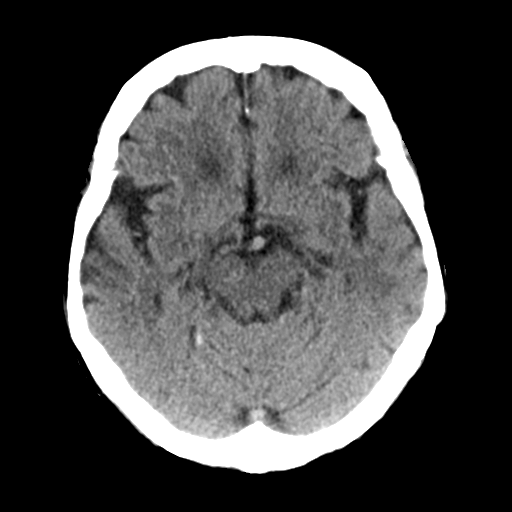
[im 13/29  brain]
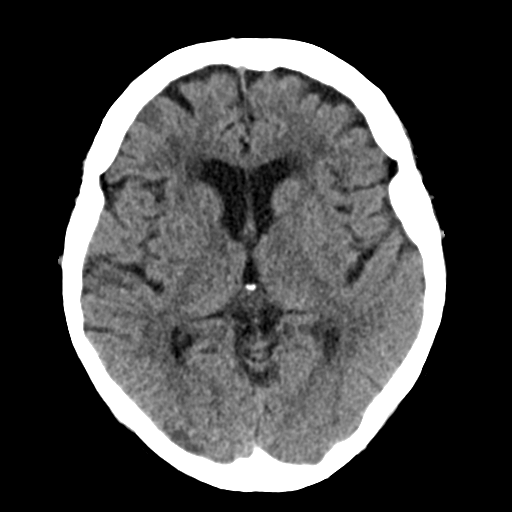
[im 17/29  brain]
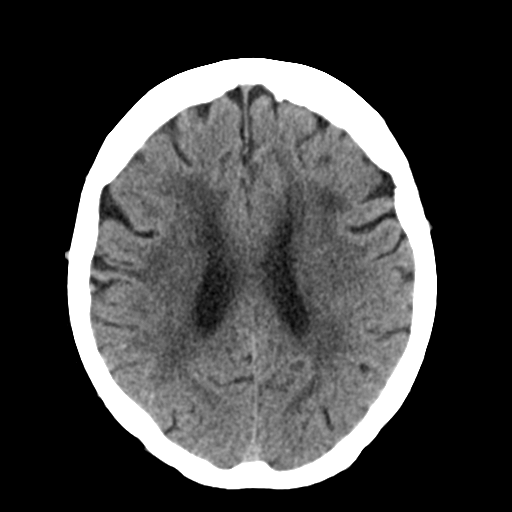
[im 17/29  bone]
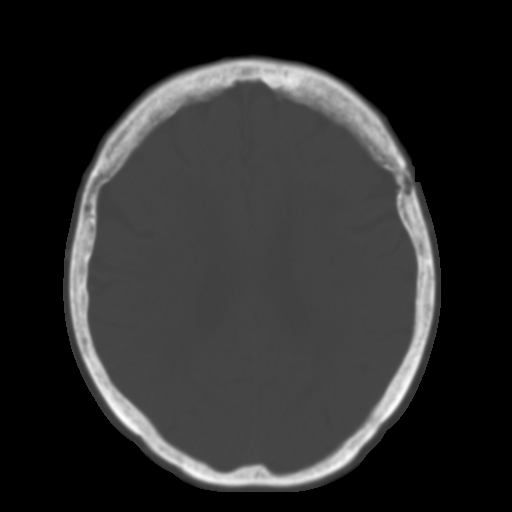
[im 20/29  brain]
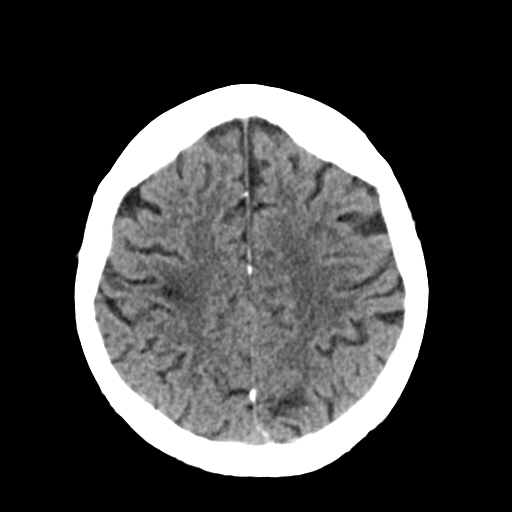
[im 23/29  brain]
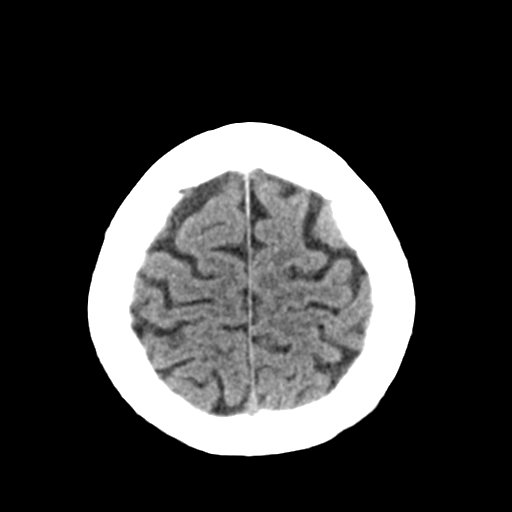
[im 27/29  brain]
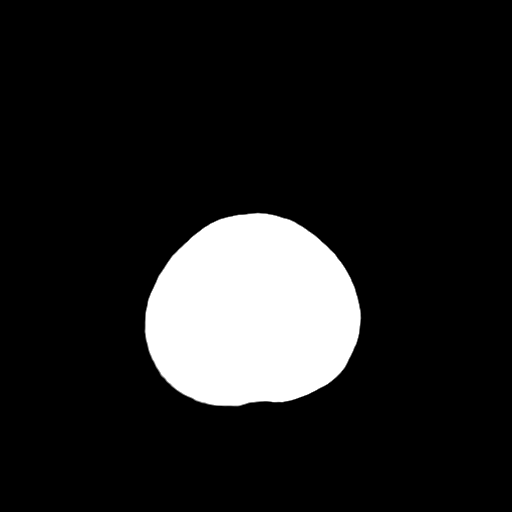

[Series 4: coronal soft tissue · coronal · 0.29mm/px · 3 of 63 slices shown]
[im 21/63  brain]
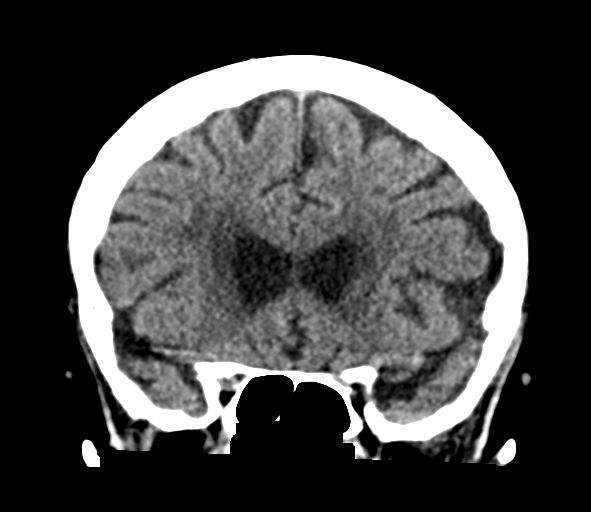
[im 28/63  brain]
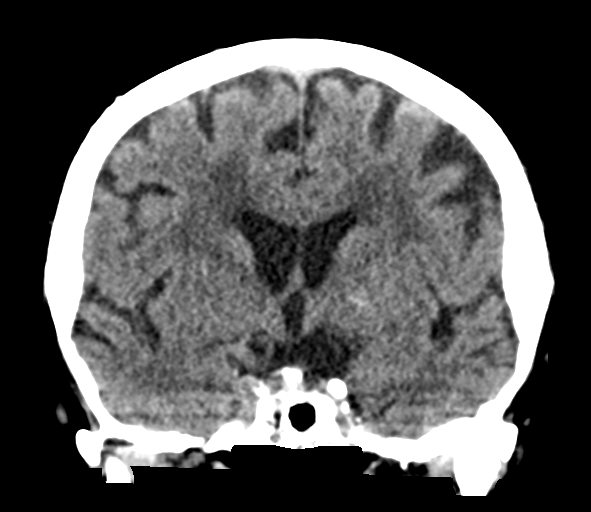
[im 35/63  brain]
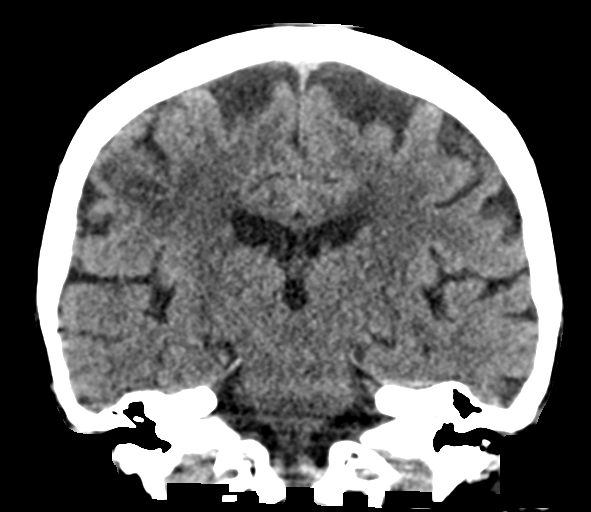

[Series 5: sagittal soft tissue · sagittal · 0.29mm/px · 3 of 56 slices shown]
[im 19/56  brain]
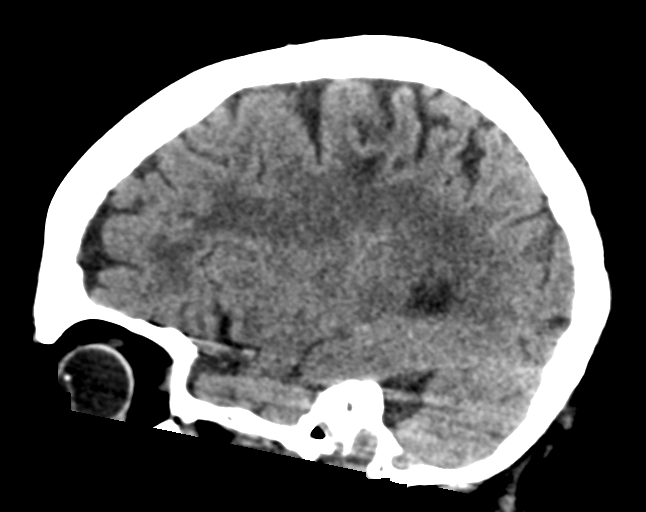
[im 28/56  brain]
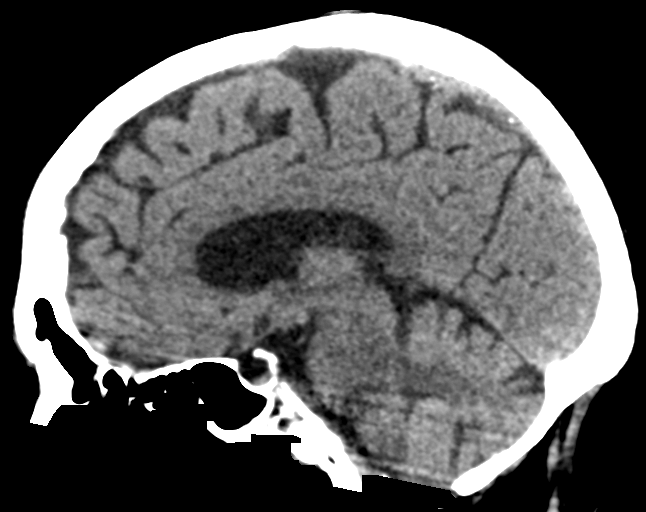
[im 37/56  brain]
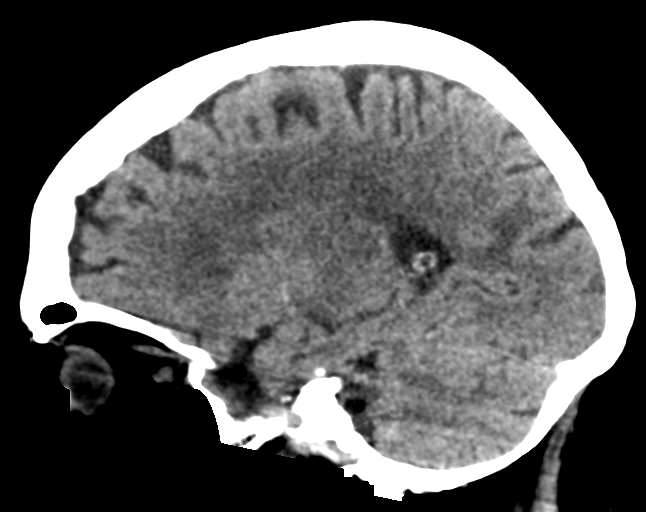

[14 of 46 positions shown; findings below may reference images not displayed]

FINDINGS: Brain: There is age related volume loss. There is no intracranial
mass, hemorrhage, extra-axial fluid collection, or midline shift.
There is patchy small vessel disease throughout the centra semiovale
bilaterally. Small vessel disease is also noted in the anterior
limbs of each internal and external capsule. There is also mild
small vessel disease in each lateral thalamus. No acute infarct is
demonstrable on this study. There is evidence of a prior small
infarct at the gray-white junction in the posteromedial left
parietal lobe. There is a prior white matter infarct in the superior
right centrum semiovale superiorly.

Vascular: There is no hyperdense vessel. There are foci of
calcification in each distal vertebral artery and carotid siphon
region.

Skull: Bony calvarium is diffusely osteoporotic. Bony calvarium
appears intact.

Sinuses/Orbits: There is mucosal thickening in several ethmoid air
cells. There is bony hypertrophy in the superior right maxillary
antrum. Visualized paranasal sinuses elsewhere clear. Visualized
orbits appear symmetric bilaterally.

Other: There is opacification in several inferior mastoid air cells
on the left. Mastoids elsewhere bilaterally are clear. There is
debris in each external auditory canal.
IMPRESSION: Atrophy with supratentorial small vessel disease. Prior infarct in
the posteromedial left parietal lobe. Small prior white matter
infarct in the high right centrum semiovale. No acute infarct
evident. No mass or hemorrhage.

There are foci of arterial vascular calcification. There is mucosal
thickening in several ethmoid air cells. There is opacification in
several inferior mastoid air cells on the left. There is probable
cerumen in each external auditory canal.

Bony calvarium diffusely osteoporotic.

These results will be called to the ordering clinician or
representative by the [HOSPITAL] at the imaging location.

## 2022-04-06 ENCOUNTER — Inpatient Hospital Stay
Admission: EM | Admit: 2022-04-06 | Discharge: 2022-04-08 | DRG: 291 | Disposition: A | Discharge status: Home / Self Care | Payer: Medicare PPO | Attending: Hospitalist | Admitting: Hospitalist

## 2022-04-06 ENCOUNTER — Emergency Department: Payer: Medicare PPO

## 2022-04-06 DIAGNOSIS — I110 Hypertensive heart disease with heart failure: Secondary | ICD-10-CM | POA: Diagnosis present

## 2022-04-06 DIAGNOSIS — D649 Anemia, unspecified: Secondary | ICD-10-CM

## 2022-04-06 DIAGNOSIS — Z23 Encounter for immunization: Secondary | ICD-10-CM | POA: Diagnosis present

## 2022-04-06 DIAGNOSIS — Z881 Allergy status to other antibiotic agents status: Secondary | ICD-10-CM | POA: Diagnosis not present

## 2022-04-06 DIAGNOSIS — M1990 Unspecified osteoarthritis, unspecified site: Secondary | ICD-10-CM | POA: Diagnosis present

## 2022-04-06 DIAGNOSIS — Z79899 Other long term (current) drug therapy: Secondary | ICD-10-CM

## 2022-04-06 DIAGNOSIS — Z720 Tobacco use: Secondary | ICD-10-CM | POA: Diagnosis not present

## 2022-04-06 DIAGNOSIS — I5021 Acute systolic (congestive) heart failure: Secondary | ICD-10-CM | POA: Insufficient documentation

## 2022-04-06 DIAGNOSIS — Z880 Allergy status to penicillin: Secondary | ICD-10-CM

## 2022-04-06 DIAGNOSIS — I480 Paroxysmal atrial fibrillation: Secondary | ICD-10-CM

## 2022-04-06 DIAGNOSIS — I952 Hypotension due to drugs: Secondary | ICD-10-CM | POA: Diagnosis not present

## 2022-04-06 DIAGNOSIS — Z20822 Contact with and (suspected) exposure to covid-19: Secondary | ICD-10-CM | POA: Diagnosis present

## 2022-04-06 DIAGNOSIS — Z882 Allergy status to sulfonamides status: Secondary | ICD-10-CM | POA: Diagnosis not present

## 2022-04-06 DIAGNOSIS — I509 Heart failure, unspecified: Secondary | ICD-10-CM | POA: Diagnosis not present

## 2022-04-06 DIAGNOSIS — R0602 Shortness of breath: Secondary | ICD-10-CM | POA: Diagnosis present

## 2022-04-06 DIAGNOSIS — Z7982 Long term (current) use of aspirin: Secondary | ICD-10-CM

## 2022-04-06 DIAGNOSIS — Z66 Do not resuscitate: Secondary | ICD-10-CM | POA: Diagnosis present

## 2022-04-06 DIAGNOSIS — I10 Essential (primary) hypertension: Secondary | ICD-10-CM | POA: Diagnosis present

## 2022-04-06 DIAGNOSIS — T502X5A Adverse effect of carbonic-anhydrase inhibitors, benzothiadiazides and other diuretics, initial encounter: Secondary | ICD-10-CM | POA: Diagnosis not present

## 2022-04-06 DIAGNOSIS — R079 Chest pain, unspecified: Secondary | ICD-10-CM

## 2022-04-06 DIAGNOSIS — J90 Pleural effusion, not elsewhere classified: Secondary | ICD-10-CM

## 2022-04-06 DIAGNOSIS — E8770 Fluid overload, unspecified: Secondary | ICD-10-CM

## 2022-04-06 DIAGNOSIS — R0789 Other chest pain: Secondary | ICD-10-CM | POA: Insufficient documentation

## 2022-04-06 LAB — BASIC METABOLIC PANEL
Anion gap: 10 (ref 5–15)
BUN: 12 mg/dL (ref 8–23)
CO2: 28 mmol/L (ref 22–32)
Calcium: 9.4 mg/dL (ref 8.9–10.3)
Chloride: 96 mmol/L — ABNORMAL LOW (ref 98–111)
Creatinine, Ser: 0.81 mg/dL (ref 0.44–1.00)
GFR, Estimated: >60 mL/min (ref >60)
Glucose, Bld: 101 mg/dL — ABNORMAL HIGH (ref 70–99)
Potassium: 3.8 mmol/L (ref 3.5–5.1)
Sodium: 134 mmol/L — ABNORMAL LOW (ref 135–145)

## 2022-04-06 LAB — CBC
HCT: 32.6 % — ABNORMAL LOW (ref 36.0–46.0)
Hemoglobin: 10.4 g/dL — ABNORMAL LOW (ref 12.0–15.0)
MCH: 31.1 pg (ref 26.0–34.0)
MCHC: 31.9 g/dL (ref 30.0–36.0)
MCV: 97.6 fL (ref 80.0–100.0)
Platelets: 228 10*3/uL (ref 150–400)
RBC: 3.34 MIL/uL — ABNORMAL LOW (ref 3.87–5.11)
RDW: 13.7 % (ref 11.5–15.5)
WBC: 7.9 10*3/uL (ref 4.0–10.5)
nRBC: 0 % (ref 0.0–0.2)

## 2022-04-06 LAB — RESP PANEL BY RT-PCR (FLU A&B, COVID) ARPGX2
Influenza A by PCR: NEGATIVE
Influenza B by PCR: NEGATIVE
SARS Coronavirus 2 by RT PCR: NEGATIVE

## 2022-04-06 LAB — BRAIN NATRIURETIC PEPTIDE: B Natriuretic Peptide: 651.9 pg/mL — ABNORMAL HIGH (ref 0.0–100.0)

## 2022-04-06 LAB — TROPONIN I (HIGH SENSITIVITY)
Troponin I (High Sensitivity): 11 ng/L (ref <18)
Troponin I (High Sensitivity): 11 ng/L (ref <18)

## 2022-04-06 MED ORDER — ATORVASTATIN CALCIUM 20 MG PO TABS
20.0000 mg | ORAL_TABLET | Freq: Every day | ORAL | Status: DC
  Administered 2022-04-07: 20 mg via ORAL
  Filled 2022-04-06: qty 1

## 2022-04-06 MED ORDER — FUROSEMIDE 10 MG/ML IJ SOLN
40.0000 mg | Freq: Two times a day (BID) | INTRAMUSCULAR | Status: DC
  Filled 2022-04-06: qty 4

## 2022-04-06 MED ORDER — METOPROLOL SUCCINATE ER 50 MG PO TB24
50.0000 mg | ORAL_TABLET | Freq: Two times a day (BID) | ORAL | Status: DC
  Administered 2022-04-06 – 2022-04-08 (×4): 50 mg via ORAL
  Filled 2022-04-06 (×4): qty 1

## 2022-04-06 MED ORDER — FLUTICASONE PROPIONATE 50 MCG/ACT NA SUSP
2.0000 | Freq: Every day | NASAL | Status: DC
  Administered 2022-04-07: 2 via NASAL
  Filled 2022-04-06 (×2): qty 16

## 2022-04-06 MED ORDER — ADULT MULTIVITAMIN W/MINERALS CH
1.0000 | ORAL_TABLET | Freq: Every day | ORAL | Status: DC
  Administered 2022-04-06 – 2022-04-08 (×3): 1 via ORAL
  Filled 2022-04-06 (×5): qty 1

## 2022-04-06 MED ORDER — ONDANSETRON HCL 4 MG/2ML IJ SOLN: 4.0000 mg | Freq: Four times a day (QID) | INTRAMUSCULAR | Status: DC | PRN

## 2022-04-06 MED ORDER — ACETAMINOPHEN 650 MG RE SUPP: 650.0000 mg | Freq: Four times a day (QID) | RECTAL | Status: DC | PRN

## 2022-04-06 MED ORDER — LORATADINE 10 MG PO TABS
10.0000 mg | ORAL_TABLET | Freq: Every day | ORAL | Status: DC
  Administered 2022-04-06 – 2022-04-08 (×3): 10 mg via ORAL
  Filled 2022-04-06 (×3): qty 1

## 2022-04-06 MED ORDER — ACETAMINOPHEN 325 MG PO TABS
650.0000 mg | ORAL_TABLET | Freq: Four times a day (QID) | ORAL | Status: DC | PRN
  Administered 2022-04-07: 650 mg via ORAL
  Filled 2022-04-06: qty 2

## 2022-04-06 MED ORDER — METAXALONE 800 MG PO TABS
800.0000 mg | ORAL_TABLET | Freq: Three times a day (TID) | ORAL | Status: DC
  Administered 2022-04-06 – 2022-04-07 (×3): 800 mg via ORAL
  Filled 2022-04-06 (×5): qty 1

## 2022-04-06 MED ORDER — GABAPENTIN 300 MG PO CAPS
300.0000 mg | ORAL_CAPSULE | Freq: Every day | ORAL | Status: DC
  Administered 2022-04-06 – 2022-04-07 (×2): 600 mg via ORAL
  Filled 2022-04-06 (×2): qty 2

## 2022-04-06 MED ORDER — POTASSIUM CHLORIDE CRYS ER 20 MEQ PO TBCR
20.0000 meq | EXTENDED_RELEASE_TABLET | Freq: Every day | ORAL | Status: DC
  Administered 2022-04-06 – 2022-04-08 (×3): 20 meq via ORAL
  Filled 2022-04-06 (×3): qty 1

## 2022-04-06 MED ORDER — ONDANSETRON HCL 4 MG PO TABS: 4.0000 mg | ORAL_TABLET | Freq: Four times a day (QID) | ORAL | Status: DC | PRN

## 2022-04-06 MED ORDER — FUROSEMIDE 10 MG/ML IJ SOLN
40.0000 mg | Freq: Two times a day (BID) | INTRAMUSCULAR | Status: DC
  Administered 2022-04-07: 40 mg via INTRAVENOUS
  Filled 2022-04-06: qty 4

## 2022-04-06 MED ORDER — TIZANIDINE HCL 2 MG PO TABS: 2.0000 mg | ORAL_TABLET | Freq: Three times a day (TID) | ORAL | Status: DC | PRN

## 2022-04-06 MED ORDER — FUROSEMIDE 10 MG/ML IJ SOLN
40.0000 mg | Freq: Once | INTRAMUSCULAR | Status: AC
  Administered 2022-04-06: 40 mg via INTRAVENOUS
  Filled 2022-04-06: qty 4

## 2022-04-06 MED ORDER — ASPIRIN 81 MG PO TBEC
81.0000 mg | DELAYED_RELEASE_TABLET | Freq: Every day | ORAL | Status: DC
  Administered 2022-04-07 – 2022-04-08 (×2): 81 mg via ORAL
  Filled 2022-04-06 (×2): qty 1

## 2022-04-06 MED ORDER — OYSTER SHELL CALCIUM/D3 500-5 MG-MCG PO TABS
2.0000 | ORAL_TABLET | Freq: Every day | ORAL | Status: DC
  Administered 2022-04-06 – 2022-04-08 (×3): 2 via ORAL
  Filled 2022-04-06 (×3): qty 2

## 2022-04-06 MED ORDER — ENOXAPARIN SODIUM 40 MG/0.4ML IJ SOSY
40.0000 mg | PREFILLED_SYRINGE | INTRAMUSCULAR | Status: DC
  Administered 2022-04-06 – 2022-04-07 (×2): 40 mg via SUBCUTANEOUS
  Filled 2022-04-06 (×2): qty 0.4

## 2022-04-06 NOTE — ED Notes (Signed)
Error on IV documenting x2.  Wrong chart

## 2022-04-06 NOTE — Assessment & Plan Note (Addendum)
Left pleural effusion Patient with shortness of breath, lower extremity edema, elevated BNP with chest x-ray showing small left pleural effusion and probable mild pulmonary interstitial edema Patient is on chronic Lasix for lower extremity edema.  No recent echocardiogram found in records.  Current Echo showed LVEF 40 to 45%. --started on IV lasix with good urine output and improvement in dyspnea. --cardiology consulted Plan: --cont IV lasix 40 BID today --cont Toprol

## 2022-04-06 NOTE — H&P (Signed)
History and Physical    Patient: Gina White [id number] DOB: [birth year] DOA: 04/06/2022 DOS: the patient was seen and examined on 04/06/2022 PCP: Kirk Ruths, MD  Patient coming from: Home  Chief Complaint:  Chief Complaint  Patient presents with   Chest Pain    Cp and sob , pt has CHF , sent from [facility]    HPI: Gina White is a 88 y.o. female with medical history significant for Paroxysmal A-fib on aspirin, HTN, anemia, chronic lower extremity edema on Lasix, without history of CHF or CAD who was sent from the [facility] clinic after she presented with a 2-day history of chest tightness and shortness of breath.  Her lower extremity edema is above baseline.  She denies cough, fever or chills.  ED course and data review: BP 160/85 with otherwise normal vitals.  Troponin 11 and BNP 651.  Hemoglobin 10.4 which is her baseline.  COVID and flu negative.  EKG, personally viewed and interpreted showing A-fib at 96 with no acute ST-T wave changes chest x-ray showing the following: IMPRESSION: Moderate cardiac enlargement with small left pleural effusion and probable mild pulmonary interstitial edema.  Patient given a dose of Lasix 40 mg IV and hospitalist consulted for admission. Patient was chest pain free at the time of my evaluation and already with improvement in symptoms with lasix administered in the ED.  Review of Systems: As mentioned in the history of present illness. All other systems reviewed and are negative.  Past Medical History:  Diagnosis Date   Arthritis    Hypertension    Past Surgical History:  Procedure Laterality Date   ABDOMINAL HYSTERECTOMY     CHOLECYSTECTOMY     JOINT REPLACEMENT     Social History:  reports that she has never smoked. Her smokeless tobacco use includes snuff. She reports that she does not drink alcohol and does not use drugs.  Allergies  Allergen Reactions   Azithromycin     Other reaction(s): Unknown   Erythromycin  Ethylsuccinate     Other reaction(s): Unknown   Penicillin V Potassium     Other reaction(s): Unknown   Penicillins Other (See Comments)    Has patient had a PCN reaction causing immediate rash, facial/tongue/throat swelling, SOB or lightheadedness with hypotension: {yes Has patient had a PCN reaction causing severe rash involving mucus membranes or skin necrosis: no Has patient had a PCN reaction that required hospitalization:no Has patient had a PCN reaction occurring within the last 10 years: :no If all of the above answers are "NO", then may proceed with Cephalosporin use.    Sulfa Antibiotics Other (See Comments)   Sulfamethoxazole-Trimethoprim Other (See Comments)    History reviewed. No pertinent family history.  Prior to Admission medications   Medication Sig Start Date End Date Taking? Authorizing Provider  aspirin EC 81 MG tablet Take 81 mg by mouth daily.   Yes [provider]  atorvastatin (LIPITOR) 20 MG tablet Take 20 mg by mouth daily at 6 PM.   Yes [provider]  bisacodyl (DULCOLAX) 5 MG EC tablet Take 5 mg by mouth daily as needed for moderate constipation.   Yes [provider]  Calcium Carbonate-Vitamin D (CALCIUM 600+D) 600-400 MG-UNIT tablet Take 2 tablets by mouth daily.   Yes [provider]  cetirizine (ZYRTEC) 10 MG tablet Take 10 mg by mouth daily.   Yes [provider]  fluticasone (FLONASE) 50 MCG/ACT nasal spray Place 2 sprays into both nostrils daily.  Yes [provider]  furosemide (LASIX) 20 MG tablet Take 20 mg by mouth daily.   Yes [provider]  gabapentin (NEURONTIN) 300 MG capsule Take 300-600 mg by mouth at bedtime.    Yes [provider]  metaxalone (SKELAXIN) 800 MG tablet Take 800 mg by mouth 3 (three) times daily.   Yes [provider]  metoprolol succinate (TOPROL-XL) 50 MG 24 hr tablet Take 50 mg by mouth 2 (two) times daily. Take with or immediately  following a meal.   Yes [provider]  Multiple Vitamins-Minerals (MULTIVITAMIN WITH MINERALS) tablet Take 1 tablet by mouth daily.   Yes [provider]  potassium chloride (MICRO-K) 10 MEQ CR capsule Take 10 mEq by mouth daily.   Yes [provider]  sennosides-docusate sodium (SENOKOT-S) 8.6-50 MG tablet Take 1 tablet by mouth daily.   Yes [provider]  tiZANidine (ZANAFLEX) 2 MG tablet Take 2 mg by mouth 3 (three) times daily as needed. 06/13/21 06/13/22 Yes [provider]  traMADol (ULTRAM) 50 MG tablet Take by mouth every 6 (six) hours as needed.   Yes [provider]  acetaminophen (TYLENOL) 500 MG tablet Take 500 mg by mouth every 6 (six) hours as needed.    [provider]  dexamethasone 0.5 MG/5ML elixir Take 1 mg by mouth 3 (three) times daily. Patient not taking: Reported on 04/06/2022    [provider]  diazepam (VALIUM) 5 MG tablet Take 5 mg by mouth at bedtime as needed for anxiety. Patient not taking: Reported on 04/06/2022    [provider]  diphenhydrAMINE (SOMINEX) 25 MG tablet Take 25 mg by mouth at bedtime as needed for sleep. Patient not taking: Reported on 04/06/2022    [provider]  EPINEPHrine 0.3 mg/0.3 mL IJ SOAJ injection Inject 0.3 mg into the muscle once.    [provider]  famotidine (PEPCID) 20 MG tablet Take 1 tablet (20 mg total) by mouth 2 (two) times daily. Patient not taking: Reported on 04/06/2022 02/29/20   Sharman Cheek, MD  glucosamine-chondroitin 500-400 MG tablet Take 1 tablet by mouth 2 (two) times daily.    [provider]  loperamide (IMODIUM A-D) 2 MG tablet Take 2 tablets (4 mg total) by mouth 4 (four) times daily as needed for diarrhea or loose stools. 02/29/20   Sharman Cheek, MD  Methylcobalamin 1 MG CHEW     [provider]  ondansetron (ZOFRAN ODT) 4 MG disintegrating tablet Take 1 tablet (4 mg total) by mouth every 8  (eight) hours as needed for nausea or vomiting. 02/29/20   Sharman Cheek, MD  Phenylephrine-DM-GG 2.11-11-48 MG/5ML LIQD     [provider]  triamcinolone (KENALOG) 0.1 % paste Use as directed 1 application in the mouth or throat 2 (two) times daily. Patient not taking: Reported on 04/06/2022 01/20/18   Domenick Gong, MD  warfarin (COUMADIN) 5 MG tablet Take 5 mg by mouth daily. Patient not taking: Reported on 04/06/2022    [provider]    Physical Exam: Vitals:   04/06/22 1339 04/06/22 1340 04/06/22 1733 04/06/22 1902  BP: (!) 160/85  (!) 157/80   Pulse: 90  90   Resp: 19  18   Temp: 98.1 F (36.7 C)   98.4 F (36.9 C)  TempSrc: Oral   Oral  SpO2: 96%  100%   Weight:  71 kg    Height:  5\' 2"  (1.575 m)     Physical Exam  Vitals and nursing note reviewed.  Constitutional:      General: She is not in acute distress.    Comments: Elderly female in no distress  HENT:     Head: Normocephalic and atraumatic.  Cardiovascular:     Rate and Rhythm: Normal rate and regular rhythm.     Heart sounds: Normal heart sounds.  Pulmonary:     Effort: Pulmonary effort is normal.     Breath sounds: Normal breath sounds.  Abdominal:     Palpations: Abdomen is soft.     Tenderness: There is no abdominal tenderness.  Neurological:     Mental Status: Mental status is at baseline.     Labs on Admission: I have personally reviewed following labs and imaging studies  CBC: Recent Labs  Lab 04/06/22 1345  WBC 7.9  HGB 10.4*  HCT 32.6*  MCV 97.6  PLT 270   Basic Metabolic Panel: Recent Labs  Lab 04/06/22 1345  NA 134*  K 3.8  CL 96*  CO2 28  GLUCOSE 101*  BUN 12  CREATININE 0.81  CALCIUM 9.4   GFR: Estimated Creatinine Clearance: 37.5 mL/min (by C-G formula based on SCr of 0.81 mg/dL). Liver Function Tests: No results for input(s): "AST", "ALT", "ALKPHOS", "BILITOT", "PROT", "ALBUMIN" in the last 168 hours. No results for input(s): "LIPASE", "AMYLASE"  in the last 168 hours. No results for input(s): "AMMONIA" in the last 168 hours. Coagulation Profile: No results for input(s): "INR", "PROTIME" in the last 168 hours. Cardiac Enzymes: No results for input(s): "CKTOTAL", "CKMB", "CKMBINDEX", "TROPONINI" in the last 168 hours. BNP (last 3 results) No results for input(s): "PROBNP" in the last 8760 hours. HbA1C: No results for input(s): "HGBA1C" in the last 72 hours. CBG: No results for input(s): "GLUCAP" in the last 168 hours. Lipid Profile: No results for input(s): "CHOL", "HDL", "LDLCALC", "TRIG", "CHOLHDL", "LDLDIRECT" in the last 72 hours. Thyroid Function Tests: No results for input(s): "TSH", "T4TOTAL", "FREET4", "T3FREE", "THYROIDAB" in the last 72 hours. Anemia Panel: No results for input(s): "VITAMINB12", "FOLATE", "FERRITIN", "TIBC", "IRON", "RETICCTPCT" in the last 72 hours. Urine analysis:    Component Value Date/Time   COLORURINE COLORLESS (A) 02/29/2020 1956   APPEARANCEUR CLEAR (A) 02/29/2020 1956   APPEARANCEUR Cloudy 05/01/2012 2347   LABSPEC 1.002 (L) 02/29/2020 1956   LABSPEC 1.025 05/01/2012 2347   PHURINE 7.0 02/29/2020 1956   GLUCOSEU NEGATIVE 02/29/2020 1956   GLUCOSEU Negative 05/01/2012 2347   HGBUR NEGATIVE 02/29/2020 1956   BILIRUBINUR NEGATIVE 02/29/2020 1956   BILIRUBINUR Negative 05/01/2012 2347   KETONESUR NEGATIVE 02/29/2020 1956   PROTEINUR NEGATIVE 02/29/2020 1956   NITRITE NEGATIVE 02/29/2020 1956   LEUKOCYTESUR NEGATIVE 02/29/2020 1956   LEUKOCYTESUR Negative 05/01/2012 2347    Radiological Exams on Admission: DG Chest 2 View  Result Date: 04/06/2022 CLINICAL DATA:  Chest pain, shortness of breath, cough and history of CHF. EXAM: CHEST - 2 VIEW COMPARISON:  04/02/2016 FINDINGS: Moderate cardiac enlargement. Small left pleural effusion and probable component of mild pulmonary interstitial edema. No pneumothorax or focal airspace consolidation. IMPRESSION: Moderate cardiac enlargement with  small left pleural effusion and probable mild pulmonary interstitial edema. Electronically Signed   By: Aletta Edouard M.D.   On: 04/06/2022 14:15     Data Reviewed: Relevant notes from primary care and specialist visits, past discharge summaries as available in EHR, including Care Everywhere. Prior diagnostic testing as pertinent to current admission diagnoses Updated medications and problem lists for reconciliation ED course, including vitals, labs,  imaging, treatment and response to treatment Triage notes, nursing and pharmacy notes and ED provider's notes Notable results as noted in HPI   Assessment and Plan: * Acute CHF (congestive heart failure) (HCC) Left pleural effusion Patient with shortness of breath, lower extremity edema, elevated BNP with chest x-ray showing small left pleural effusion and probable mild pulmonary interstitial edema Patient is on chronic Lasix for lower extremity edema.  No recent echocardiogram found in records Continue Lasix 40 mg IV twice daily.  Continue metoprolol Daily weights with intake and output monitoring Echocardiogram in the a.m. Cardiology consult  Chest pain EKG nonacute and troponin normal at 11 Suspect secondary to shortness of breath and possibly small left pleural effusion Follow-up echocardiogram to evaluate for focal wall motion abnormality Continue aspirin, atorvastatin and metoprolol No prior history of CAD and no prior cardiac studies seen on chart review Cardiology consulted  Paroxysmal atrial fibrillation (HCC) Continue aspirin and metoprolol Was previously on Coumadin until 05/2021 which was discontinued due to easy bruising  Chronic anemia Hemoglobin stable at 10.4.  Was 9.7 back in May 2023  Essential hypertension Continue metoprolol        DVT prophylaxis: lovenox  Consults: [facility] cardiology, Dr. Clayborn Bigness  Advance Care Planning: DNR  Family Communication: daughter   Disposition Plan: Back to previous home  environment  Severity of Illness: The appropriate patient status for this patient is INPATIENT. Inpatient status is judged to be reasonable and necessary in order to provide the required intensity of service to ensure the patient's safety. The patient's presenting symptoms, physical exam findings, and initial radiographic and laboratory data in the context of their chronic comorbidities is felt to place them at high risk for further clinical deterioration. Furthermore, it is not anticipated that the patient will be medically stable for discharge from the hospital within 2 midnights of admission.   * I certify that at the point of admission it is my clinical judgment that the patient will require inpatient hospital care spanning beyond 2 midnights from the point of admission due to high intensity of service, high risk for further deterioration and high frequency of surveillance required.*  Author: Athena Masse, MD 04/06/2022 7:58 PM  For on call review [email or web address].

## 2022-04-06 NOTE — IPAL (Signed)
  Interdisciplinary Goals of Care Family Meeting   Date carried out: 04/06/2022  Location of the meeting: Phone conference  Member's involved: Physician and Family Member or next of kin  Durable Power of Attorney or Loss adjuster, chartered: patient    Discussion: We discussed goals of care for [employer] .    Code status: Full DNR  Disposition: Continue current acute care  Time spent for the meeting: [place], MD  04/06/2022, 8:23 PM

## 2022-04-06 NOTE — ED Notes (Signed)
Patient sent to ED from [facility] for CP and SOB. Patient initially seen for cough and bilateral leg edema. Hx of afib and is currently on eliquis.

## 2022-04-06 NOTE — ED Notes (Signed)
Alejandra RN obtained report from chart.  Pt taken to [facility] via tech.

## 2022-04-06 NOTE — Assessment & Plan Note (Signed)
Hemoglobin stable at 10.4.  Was 9.7 back in May 2023

## 2022-04-06 NOTE — ED Provider Triage Note (Signed)
Emergency Medicine Provider Triage Evaluation Note  AMEE BOOTHE , a 88 y.o. female  was evaluated in triage.  Pt complains of shortness of breath.  Sent over by [facility] for evaluation of CHF chest pain.  Review of Systems  Positive:  Negative:   Physical Exam  BP (!) 160/85 (BP Location: Right Arm)   Pulse 90   Temp 98.1 F (36.7 C) (Oral)   Resp 19   Ht 5\' 2"  (1.575 m)   Wt 71 kg   SpO2 96%   BMI 28.63 kg/m  Gen:   Awake, no distress   Resp:  Normal effort  MSK:   Moves extremities without difficulty  Other:    Medical Decision Making  Medically screening exam initiated at 1:42 PM.  Appropriate orders placed.  REIDA HEM was informed that the remainder of the evaluation will be completed by another provider, this initial triage assessment does not replace that evaluation, and the importance of remaining in the ED until their evaluation is complete.     Versie Starks, PA-C 04/06/22 1342

## 2022-04-06 NOTE — Assessment & Plan Note (Addendum)
Was previously on Coumadin until 05/2021 which was discontinued due to easy bruising --Continue aspirin and metoprolol

## 2022-04-06 NOTE — ED Triage Notes (Signed)
Sent from [facility], pt has cp and SOB, pt has CHF, x 2 days

## 2022-04-06 NOTE — Assessment & Plan Note (Addendum)
EKG nonacute and troponin normal at 11 Suspect secondary to shortness of breath

## 2022-04-06 NOTE — Assessment & Plan Note (Signed)
Continue metoprolol. 

## 2022-04-07 ENCOUNTER — Encounter: Payer: Self-pay | Admitting: Internal Medicine

## 2022-04-07 ENCOUNTER — Inpatient Hospital Stay
Admit: 2022-04-07 | Discharge: 2022-04-07 | Disposition: A | Discharge status: Home / Self Care | Payer: Medicare PPO | Attending: Internal Medicine | Admitting: Internal Medicine

## 2022-04-07 ENCOUNTER — Other Ambulatory Visit: Payer: Self-pay

## 2022-04-07 DIAGNOSIS — I5021 Acute systolic (congestive) heart failure: Secondary | ICD-10-CM | POA: Diagnosis not present

## 2022-04-07 LAB — BASIC METABOLIC PANEL
Anion gap: 6 (ref 5–15)
BUN: 14 mg/dL (ref 8–23)
CO2: 30 mmol/L (ref 22–32)
Calcium: 9.3 mg/dL (ref 8.9–10.3)
Chloride: 102 mmol/L (ref 98–111)
Creatinine, Ser: 0.74 mg/dL (ref 0.44–1.00)
GFR, Estimated: >60 mL/min (ref >60)
Glucose, Bld: 88 mg/dL (ref 70–99)
Potassium: 3.7 mmol/L (ref 3.5–5.1)
Sodium: 138 mmol/L (ref 135–145)

## 2022-04-07 LAB — ECHOCARDIOGRAM COMPLETE
AR max vel: 2.63 cm2
AV Area VTI: 3.05 cm2
AV Area mean vel: 2.32 cm2
AV Mean grad: 2 mmHg
AV Peak grad: 4.4 mmHg
Ao pk vel: 1.05 m/s
Area-P 1/2: 4.31 cm2
Calc EF: 49.7 %
Height: 62 in
P 1/2 time: 438 msec
S' Lateral: 3.6 cm
Single Plane A2C EF: 44.9 %
Single Plane A4C EF: 51.6 %
Weight: 2504.43 oz

## 2022-04-07 MED ORDER — FUROSEMIDE 10 MG/ML IJ SOLN
40.0000 mg | Freq: Once | INTRAMUSCULAR | Status: AC
  Administered 2022-04-07: 40 mg via INTRAVENOUS
  Filled 2022-04-07: qty 4

## 2022-04-07 MED ORDER — ORAL CARE MOUTH RINSE: 15.0000 mL | OROMUCOSAL | Status: DC | PRN

## 2022-04-07 MED ORDER — INFLUENZA VAC A&B SA ADJ QUAD 0.5 ML IM PRSY
0.5000 mL | PREFILLED_SYRINGE | INTRAMUSCULAR | Status: AC
  Administered 2022-04-08: 0.5 mL via INTRAMUSCULAR
  Filled 2022-04-07 (×2): qty 0.5

## 2022-04-07 NOTE — Consult Note (Signed)
   Heart Failure Nurse Navigator Note  HF--echocardiogram results pending at this time.  No previous results noted.  She presented to the ED with complaints of chest tightness, worsening SOB and worsening Lower extremity edema.  Comorbidites:  PAF HTN CAD Arthritis CKD stage 3   Medications:  Aspirin 81 mg daily Atorvastatin 20 mg in the PM Lasix 40 mg IV times 2 Metoprolol succinate 50 mg twice daily  Labs:  Sodium 138, potassium 3.8, chloride 96, co 2 96, BUN 14, creatinine 0.74, GFR >60. Weight 71 kg BP 139/75   Initial meeting with patient, she is lying quietly in bed currently on O2 per nasal cannula in no acute distress. Chest x-ray with mild pulmonary interstitial edema.  Discussed heart failure.  States she has not heard that term in relationship to herself.  But understands that the the heart is not meeting the demands of the body.  States that she has a grandson that lives with her.  She is the one that prepares the meals, she states that she does cook with salt and does use a little salt at the table.  Explained the relationship between salt/sodium and fluids.  She also admits to using processed meats etc. she also likes to eat canned soups, does not buy the low sodium ones.  She states that she has a scale but she does not weigh herself on a daily basis.  Explained the reasoning behind daily weights and what to report.  She voices understanding.  Discussed the Heart Failure Clinic for which she has an appointment on October 4 at 3:30 PM.  She was given the living with heart failure teaching booklet, zone magnet, info on low sodium foods and heart failure, along with weight chart.  She has no further questions.  Pricilla Riffle RN CHFN

## 2022-04-07 NOTE — Discharge Instructions (Signed)

## 2022-04-07 NOTE — Consult Note (Signed)
Gina White is a 88 y.o. female  [id number]  Primary Cardiologist: Neoma Laming Reason for Consultation: Atrial fibrillation/CHF  HPI: This is a 88 year old white female with history of chronic atrial fibrillation and hypertension presented to the hospital with severe shortness of breath described as gasping for breath.  She denied any chest pain but did have intermittent palpitation.   Review of Systems: Orthopnea PND   Past Medical History:  Diagnosis Date   Arthritis    Hypertension     (Not in a hospital admission)     aspirin EC  81 mg Oral Daily   atorvastatin  20 mg Oral q1800   calcium-vitamin D  2 tablet Oral Daily   enoxaparin (LOVENOX) injection  40 mg Subcutaneous Q24H   fluticasone  2 spray Each Nare Daily   gabapentin  300-600 mg Oral QHS   loratadine  10 mg Oral Daily   metaxalone  800 mg Oral TID   metoprolol succinate  50 mg Oral BID   multivitamin with minerals  1 tablet Oral Daily   potassium chloride  20 mEq Oral Daily    Infusions:   Allergies  Allergen Reactions   Azithromycin     Other reaction(s): Unknown   Erythromycin Ethylsuccinate     Other reaction(s): Unknown   Penicillin V Potassium     Other reaction(s): Unknown   Penicillins Other (See Comments)    Has patient had a PCN reaction causing immediate rash, facial/tongue/throat swelling, SOB or lightheadedness with hypotension: {yes Has patient had a PCN reaction causing severe rash involving mucus membranes or skin necrosis: no Has patient had a PCN reaction that required hospitalization:no Has patient had a PCN reaction occurring within the last 10 years: :no If all of the above answers are "NO", then may proceed with Cephalosporin use.    Sulfa Antibiotics Other (See Comments)   Sulfamethoxazole-Trimethoprim Other (See Comments)    Social History   Socioeconomic History   Marital status: Widowed    Spouse name: Not on file   Number of children: Not on file   Years  of education: Not on file   Highest education level: Not on file  Occupational History   Not on file  Tobacco Use   Smoking status: Never   Smokeless tobacco: Current    Types: Snuff  Substance and Sexual Activity   Alcohol use: No   Drug use: Never   Sexual activity: Not on file  Other Topics Concern   Not on file  Social History Narrative   Not on file   Social Determinants of Health   Financial Resource Strain: Not on file  Food Insecurity: No Food Insecurity (04/07/2022)   Hunger Vital Sign    Worried About Running Out of Food in the Last Year: Never true    Ran Out of Food in the Last Year: Never true  Transportation Needs: No Transportation Needs (04/07/2022)   PRAPARE - Hydrologist (Medical): No    Lack of Transportation (Non-Medical): No  Physical Activity: Not on file  Stress: Not on file  Social Connections: Not on file  Intimate Partner Violence: Not At Risk (04/07/2022)   Humiliation, Afraid, Rape, and Kick questionnaire    Fear of Current or Ex-Partner: No    Emotionally Abused: No    Physically Abused: No    Sexually Abused: No    History reviewed. No pertinent family history.  PHYSICAL EXAM: Vitals:   04/07/22 1100  04/07/22 1230  BP: 139/75   Pulse: 88   Resp: 17   Temp:  97.7 F (36.5 C)  SpO2: 99%      Intake/Output Summary (Last 24 hours) at 04/07/2022 1325 Last data filed at 04/07/2022 1259 Gross per 24 hour  Intake --  Output 2200 ml  Net -2200 ml    General:  Well appearing. No respiratory difficulty HEENT: normal Neck: supple. no JVD. Carotids 2+ bilat; no bruits. No lymphadenopathy or thryomegaly appreciated. Cor: PMI nondisplaced. Regular rate & rhythm. No rubs, gallops or murmurs. Lungs: clear Abdomen: soft, nontender, nondistended. No hepatosplenomegaly. No bruits or masses. Good bowel sounds. Extremities: no cyanosis, clubbing, rash, edema Neuro: alert & oriented x 3, cranial nerves grossly intact.  moves all 4 extremities w/o difficulty. Affect pleasant.  ECG: Atrial fibrillation with nonspecific ST-T changes  Results for orders placed or performed during the hospital encounter of 04/06/22 (from the past 24 hour(s))  Basic metabolic panel     Status: Abnormal   Collection Time: 04/06/22  1:45 PM  Result Value Ref Range   Sodium 134 (L) 135 - 145 mmol/L   Potassium 3.8 3.5 - 5.1 mmol/L   Chloride 96 (L) 98 - 111 mmol/L   CO2 28 22 - 32 mmol/L   Glucose, Bld 101 (H) 70 - 99 mg/dL   BUN 12 8 - 23 mg/dL   Creatinine, Ser 8.67 0.44 - 1.00 mg/dL   Calcium 9.4 8.9 - 61.9 mg/dL   GFR, Estimated >50 >93 mL/min   Anion gap 10 5 - 15  CBC     Status: Abnormal   Collection Time: 04/06/22  1:45 PM  Result Value Ref Range   WBC 7.9 4.0 - 10.5 K/uL   RBC 3.34 (L) 3.87 - 5.11 MIL/uL   Hemoglobin 10.4 (L) 12.0 - 15.0 g/dL   HCT 26.7 (L) 12.4 - 58.0 %   MCV 97.6 80.0 - 100.0 fL   MCH 31.1 26.0 - 34.0 pg   MCHC 31.9 30.0 - 36.0 g/dL   RDW 99.8 33.8 - 25.0 %   Platelets 228 150 - 400 K/uL   nRBC 0.0 0.0 - 0.2 %  Troponin I (High Sensitivity)     Status: None   Collection Time: 04/06/22  1:45 PM  Result Value Ref Range   Troponin I (High Sensitivity) 11 <18 ng/L  Brain natriuretic peptide     Status: Abnormal   Collection Time: 04/06/22  1:45 PM  Result Value Ref Range   B Natriuretic Peptide 651.9 (H) 0.0 - 100.0 pg/mL  Resp Panel by RT-PCR (Flu A&B, Covid) Anterior Nasal Swab     Status: None   Collection Time: 04/06/22  1:45 PM   Specimen: Anterior Nasal Swab  Result Value Ref Range   SARS Coronavirus 2 by RT PCR NEGATIVE NEGATIVE   Influenza A by PCR NEGATIVE NEGATIVE   Influenza B by PCR NEGATIVE NEGATIVE  Troponin I (High Sensitivity)     Status: None   Collection Time: 04/06/22  7:03 PM  Result Value Ref Range   Troponin I (High Sensitivity) 11 <18 ng/L  Basic metabolic panel     Status: None   Collection Time: 04/07/22  5:37 AM  Result Value Ref Range   Sodium 138 135 -  145 mmol/L   Potassium 3.7 3.5 - 5.1 mmol/L   Chloride 102 98 - 111 mmol/L   CO2 30 22 - 32 mmol/L   Glucose, Bld 88 70 - 99 mg/dL  BUN 14 8 - 23 mg/dL   Creatinine, Ser 2.48 0.44 - 1.00 mg/dL   Calcium 9.3 8.9 - 25.0 mg/dL   GFR, Estimated >03 >70 mL/min   Anion gap 6 5 - 15   DG Chest 2 View  Result Date: 04/06/2022 CLINICAL DATA:  Chest pain, shortness of breath, cough and history of CHF. EXAM: CHEST - 2 VIEW COMPARISON:  04/02/2016 FINDINGS: Moderate cardiac enlargement. Small left pleural effusion and probable component of mild pulmonary interstitial edema. No pneumothorax or focal airspace consolidation. IMPRESSION: Moderate cardiac enlargement with small left pleural effusion and probable mild pulmonary interstitial edema. Electronically Signed   By: Irish Lack M.D.   On: 04/06/2022 14:15     ASSESSMENT AND PLAN: Atrial fibrillation with controlled ventricular rate and congestive heart failure.  Agree with continuing metoprolol Lovenox and IV Lasix.  Patient cannot tolerate anticoagulants because of bleeding.  Thus baby aspirin is always indicated at this time.  May consider adding losartan 25 mg once a day.  We will follow the patient closely with you.  Kallon Caylor A

## 2022-04-07 NOTE — ED Notes (Addendum)
Patient's daughter states patient has c/o intermittent BLE cramps. Daughter states the patient wasn't having any cramps this morning which is why they refused the Skelaxin.

## 2022-04-07 NOTE — ED Notes (Signed)
Report received from Serenity RN 

## 2022-04-07 NOTE — Progress Notes (Addendum)
Nutrition Brief Note  RD pulled to chart secondary to CHF diagnosis.   Wt Readings from Last 15 Encounters:  04/06/22 71 kg  02/29/20 71 kg  01/24/18 71.2 kg  01/20/18 70.3 kg  01/15/17 71.6 kg  04/02/16 69.9 kg  11/04/15 69.9 kg   Pt with medical history significant for Paroxysmal A-fib on aspirin, HTN, anemia, chronic lower extremity edema on Lasix, without history of CHF or CAD who presented with a 2-day history of chest tightness and shortness of breath.   Pt admitted with CHF.   RD provided "Low Sodium Nutrition Therapy" handout from [facility] Nutrition Care Manual; attached to AVS/ discharge summary.   Medications reviewed and include calcium with vitamin D and potassium chloride.   Labs reviewed.   Current diet order is Heart Healthy (liberalize to 2 gram sodium), patient is consuming approximately n/a% of meals at this time. Labs and medications reviewed.   No nutrition interventions warranted at this time. If nutrition issues arise, please consult RD.   Loistine Chance, RD, LDN, [place] Registered Dietitian II Certified Diabetes Care and Education Specialist Please refer to [facility] for RD and/or RD on-call/weekend/after hours pager

## 2022-04-07 NOTE — Progress Notes (Signed)
  PROGRESS NOTE    Gina White  MEQ:[id number] DOB: [birth year] DOA: 04/06/2022 PCP: Kirk Ruths, MD  [id number]  LOS: 1 day   Brief hospital course:   Assessment & Plan: Gina White is a 88 y.o. female with medical history significant for Paroxysmal A-fib on aspirin, HTN, anemia, chronic lower extremity edema on Lasix, without history of CHF or CAD who was sent from the [facility] clinic after she presented with a 2-day history of chest tightness and shortness of breath.  Her lower extremity edema is above baseline.   * Acute systolic CHF (congestive heart failure) (HCC) Left pleural effusion Patient with shortness of breath, lower extremity edema, elevated BNP with chest x-ray showing small left pleural effusion and probable mild pulmonary interstitial edema Patient is on chronic Lasix for lower extremity edema.  No recent echocardiogram found in records.  Current Echo showed LVEF 40 to 45%. --started on IV lasix with good urine output and improvement in dyspnea. --cardiology consulted Plan: --cont IV lasix 40 BID today --cont Toprol  Atypical chest pain EKG nonacute and troponin normal at 11 Suspect secondary to shortness of breath   Paroxysmal atrial fibrillation (HCC) Was previously on Coumadin until 05/2021 which was discontinued due to easy bruising --Continue aspirin and metoprolol  Chronic anemia Hemoglobin stable at 10.4.  Was 9.7 back in May 2023  Essential hypertension Continue metoprolol   DVT prophylaxis: Lovenox SQ Code Status: DNR  Family Communication: daughter updated at bedside today Level of care: Progressive Dispo:   The patient is from: home Anticipated d/c is to: home Anticipated d/c date is: 1-2 days   Subjective and Interval History:  Pt reported feeling better.  Reported good urine output with IV lasix.   Objective: Vitals:   04/07/22 1100 04/07/22 1230 04/07/22 1541 04/07/22 1712  BP: 139/75  (!) 153/95 118/64   Pulse: 88   92  Resp: 17   18  Temp:  97.7 F (36.5 C) 97.8 F (36.6 C) 98.1 F (36.7 C)  TempSrc:  Oral Oral   SpO2: 99%   97%  Weight:      Height:        Intake/Output Summary (Last 24 hours) at 04/07/2022 1845 Last data filed at 04/07/2022 1259 Gross per 24 hour  Intake --  Output 2200 ml  Net -2200 ml   Filed Weights   04/06/22 1340  Weight: 71 kg    Examination:   Constitutional: NAD, AAOx3 HEENT: conjunctivae and lids normal, EOMI CV: No cyanosis.   RESP: normal respiratory effort, on RA Extremities: edema in BLE SKIN: warm, dry Neuro: II - XII grossly intact.   Psych: Normal mood and affect.     Data Reviewed: I have personally reviewed labs and imaging studies  Time spent: 50 minutes  Enzo Bi, MD [facility] If 7PM-7AM, please contact night-coverage 04/07/2022, 6:45 PM

## 2022-04-07 NOTE — Progress Notes (Signed)
Patient OOB to recliner with 1 assistance from daughter. Tolerated well.   Patient placed back in bed by this RN. Daughter remains at bedside. Denies further needs at this time.

## 2022-04-08 LAB — BASIC METABOLIC PANEL
Anion gap: 11 (ref 5–15)
BUN: 24 mg/dL — ABNORMAL HIGH (ref 8–23)
CO2: 28 mmol/L (ref 22–32)
Calcium: 9.3 mg/dL (ref 8.9–10.3)
Chloride: 92 mmol/L — ABNORMAL LOW (ref 98–111)
Creatinine, Ser: 1.1 mg/dL — ABNORMAL HIGH (ref 0.44–1.00)
GFR, Estimated: 46 mL/min — ABNORMAL LOW (ref >60)
Glucose, Bld: 103 mg/dL — ABNORMAL HIGH (ref 70–99)
Potassium: 3.5 mmol/L (ref 3.5–5.1)
Sodium: 131 mmol/L — ABNORMAL LOW (ref 135–145)

## 2022-04-08 LAB — CBC
HCT: 32.9 % — ABNORMAL LOW (ref 36.0–46.0)
Hemoglobin: 10.9 g/dL — ABNORMAL LOW (ref 12.0–15.0)
MCH: 30.6 pg (ref 26.0–34.0)
MCHC: 33.1 g/dL (ref 30.0–36.0)
MCV: 92.4 fL (ref 80.0–100.0)
Platelets: 231 10*3/uL (ref 150–400)
RBC: 3.56 MIL/uL — ABNORMAL LOW (ref 3.87–5.11)
RDW: 13.3 % (ref 11.5–15.5)
WBC: 8.9 10*3/uL (ref 4.0–10.5)
nRBC: 0 % (ref 0.0–0.2)

## 2022-04-08 LAB — MAGNESIUM: Magnesium: 2.1 mg/dL (ref 1.7–2.4)

## 2022-04-08 MED ORDER — ENALAPRIL MALEATE 2.5 MG PO TABS: 2.5000 mg | ORAL_TABLET | Freq: Every day | ORAL | 2 refills | Status: DC

## 2022-04-08 MED ORDER — SODIUM CHLORIDE 0.9 % IV BOLUS: 500.0000 mL | Freq: Once | INTRAVENOUS | Status: DC

## 2022-04-08 MED ORDER — ENOXAPARIN SODIUM 30 MG/0.3ML IJ SOSY: 30.0000 mg | PREFILLED_SYRINGE | INTRAMUSCULAR | Status: DC

## 2022-04-08 MED ORDER — SODIUM CHLORIDE 0.9 % IV BOLUS
250.0000 mL | Freq: Once | INTRAVENOUS | Status: AC
  Administered 2022-04-08: 250 mL via INTRAVENOUS

## 2022-04-08 NOTE — Progress Notes (Signed)
SUBJECTIVE: Patient is feeling much better denies any chest pain or shortness of breath   Vitals:   04/08/22 0133 04/08/22 0427 04/08/22 0618 04/08/22 0746  BP: (!) 110/56 (!) 80/47 (!) 90/54 120/74  Pulse: 91 76 86 81  Resp: 16 18 16 18   Temp: 98.2 F (36.8 C) 98 F (36.7 C) 97.7 F (36.5 C) 98.1 F (36.7 C)  TempSrc:      SpO2: 95% 94% 94% 94%  Weight:      Height:        Intake/Output Summary (Last 24 hours) at 04/08/2022 1139 Last data filed at 04/07/2022 1859 Gross per 24 hour  Intake 360 ml  Output 500 ml  Net -140 ml    LABS: Basic Metabolic Panel: Recent Labs    04/07/22 0537 04/08/22 0440  NA 138 131*  K 3.7 3.5  CL 102 92*  CO2 30 28  GLUCOSE 88 103*  BUN 14 24*  CREATININE 0.74 1.10*  CALCIUM 9.3 9.3  MG  --  2.1   Liver Function Tests: No results for input(s): "AST", "ALT", "ALKPHOS", "BILITOT", "PROT", "ALBUMIN" in the last 72 hours. No results for input(s): "LIPASE", "AMYLASE" in the last 72 hours. CBC: Recent Labs    04/06/22 1345 04/08/22 0440  WBC 7.9 8.9  HGB 10.4* 10.9*  HCT 32.6* 32.9*  MCV 97.6 92.4  PLT 228 231   Cardiac Enzymes: No results for input(s): "CKTOTAL", "CKMB", "CKMBINDEX", "TROPONINI" in the last 72 hours. BNP: Invalid input(s): "POCBNP" D-Dimer: No results for input(s): "DDIMER" in the last 72 hours. Hemoglobin A1C: No results for input(s): "HGBA1C" in the last 72 hours. Fasting Lipid Panel: No results for input(s): "CHOL", "HDL", "LDLCALC", "TRIG", "CHOLHDL", "LDLDIRECT" in the last 72 hours. Thyroid Function Tests: No results for input(s): "TSH", "T4TOTAL", "T3FREE", "THYROIDAB" in the last 72 hours.  Invalid input(s): "FREET3" Anemia Panel: No results for input(s): "VITAMINB12", "FOLATE", "FERRITIN", "TIBC", "IRON", "RETICCTPCT" in the last 72 hours.   PHYSICAL EXAM General: Well developed, well nourished, in no acute distress HEENT:  Normocephalic and atramatic Neck:  No JVD.  Lungs: Clear bilaterally  to auscultation and percussion. Heart: HRRR . Normal S1 and S2 without gallops or murmurs.  Abdomen: Bowel sounds are positive, abdomen soft and non-tender  Msk:  Back normal, normal gait. Normal strength and tone for age. Extremities: No clubbing, cyanosis or edema.   Neuro: Alert and oriented X 3. Psych:  Good affect, responds appropriately  TELEMETRY: Atrial fibrillation with controlled ventricular rate about 85/min  ASSESSMENT AND PLAN: Congestive heart failure with atrial fibrillation with controlled ventricular rate.  Advise sending the patient home on Lasix 20 mg once a day and enalapril 2.5 p.o. once a day and continue metoprolol.  Patient can be seen next Thursday at 1 PM in my office.  Principal Problem:   Acute systolic CHF (congestive heart failure) (HCC) Active Problems:   Essential hypertension   Paroxysmal atrial fibrillation (HCC)   Chronic anemia   Atypical chest pain   Pleural effusion on left    Nabor Thomann A, MD, [facility] 04/08/2022 11:39 AM

## 2022-04-08 NOTE — Progress Notes (Addendum)
       CROSS COVER NOTE  NAME: Gina White MRN: [id number] DOB : 30-Jul-1923    HPI/Events of Note   "sodium is 131, creatinine 1.10, patient is drowsy, and hypotensive 90/54"  Assessment and  Interventions   Assessment: Hypotension, suspect related to overdiuresis in the management of acute systolic CHF  Plan: NS bolus 216ml and repeat BP still low after bolus X X

## 2022-04-08 NOTE — TOC Progression Note (Signed)
Transition of Care [facility]) - Progression Note    Patient Details  Name: Gina White MRN: [id number] Date of Birth: 12-26-1923  Transition of Care [facility]) CM/SW Contact  Izola Price, RN Phone Number: 04/08/2022, 10:36 AM  Clinical Narrative:  9/30: HF RN saw patient on 04/07/22. Note in EMR.  Grandson lives with patient. Has a scale per HR RN but does not weigh self everyday. RE=educated on need for this and per notes, patient voiced understanding. Appointment with HF clinic 04/12/22 at 330 pm. Discussed with patient by HF RN along with educational material provided and weight chart, low Na foods, Zone magnet, HF teaching booklet. Per HF RN patient voiced no further questions at that time. [name] RN CM          Expected Discharge Plan and Services           Expected Discharge Date: 04/08/22                                     Social Determinants of Health (SDOH) Interventions    Readmission Risk Interventions     No data to display

## 2022-04-08 NOTE — Discharge Summary (Signed)
Physician Discharge Summary   Gina White  female DOB: [birth year]  KKX:[id number]  PCP: Kirk Ruths, MD  Admit date: 04/06/2022 Discharge date: 04/08/2022  Admitted From: home Disposition:  home CODE STATUS: DNR  Discharge Instructions     Discharge instructions   Complete by: As directed    You have congestive heart failure.  Cardiologist Dr. Humphrey Rolls started you on Enalapril.  Please continue your home lasix and Toprol and follow up with Dr. Humphrey Rolls in his clinic.   Dr. Enzo Bi Advanced Pain Management Course:  For full details, please see H&P, progress notes, consult notes and ancillary notes.  Briefly,  Gina White is a 88 y.o. female with medical history significant for Paroxysmal A-fib on aspirin, HTN, anemia, chronic lower extremity edema on Lasix, without history of CHF or CAD who was sent from the [facility] clinic after she presented with a 2-day history of chest tightness and shortness of breath.  Her lower extremity edema was above baseline.   * Acute systolic CHF (congestive heart failure) (HCC) Left pleural effusion Patient with shortness of breath, lower extremity edema, elevated BNP with chest x-ray showing small left pleural effusion and probable mild pulmonary interstitial edema Patient is on chronic Lasix for lower extremity edema.  No recent echocardiogram found in records.  Current Echo showed LVEF 40 to 45%. --started on IV lasix with good urine output and improvement in dyspnea. --cardiology consulted --pt was discharged on home lasix and Toprol.  Enalapril added.  Pt will follow up with Dr. Humphrey Rolls in outpatient clinic.  Atypical chest pain EKG nonacute and troponin normal at 11 Suspect secondary to shortness of breath    Paroxysmal atrial fibrillation (HCC) Was previously on Coumadin until 05/2021 which was discontinued due to easy bruising --Continue aspirin and metoprolol   Chronic anemia Hemoglobin stable at 10.4.  Was 9.7 back in May  2023   Essential hypertension Continue metoprolol  Unless noted above, medications under "STOP" list are ones pt was not taking PTA.  Discharge Diagnoses:  Principal Problem:   Acute systolic CHF (congestive heart failure) (HCC) Active Problems:   Atypical chest pain   Paroxysmal atrial fibrillation (HCC)   Chronic anemia   Essential hypertension   Pleural effusion on left   30 Day Unplanned Readmission Risk Score    Flowsheet Row ED to Hosp-Admission (Current) from 04/06/2022 in [facility]  30 Day Unplanned Readmission Risk Score (%) 17.97 Filed at 04/08/2022 0800       This score is the patient's risk of an unplanned readmission within 30 days of being discharged (0 -100%). The score is based on dignosis, age, lab data, medications, orders, and past utilization.   Low:  0-14.9   Medium: 15-21.9   High: 22-29.9   Extreme: 30 and above         Discharge Instructions:  Allergies as of 04/08/2022       Reactions   Azithromycin    Other reaction(s): Unknown   Erythromycin Ethylsuccinate    Other reaction(s): Unknown   Penicillin V Potassium    Other reaction(s): Unknown   Penicillins Other (See Comments)   Has patient had a PCN reaction causing immediate rash, facial/tongue/throat swelling, SOB or lightheadedness with hypotension: {yes Has patient had a PCN reaction causing severe rash involving mucus membranes or skin necrosis: no Has patient had a PCN reaction that required hospitalization:no Has patient had a PCN reaction occurring within the  last 10 years: :no If all of the above answers are "NO", then may proceed with Cephalosporin use.   Sulfa Antibiotics Other (See Comments)   Sulfamethoxazole-trimethoprim Other (See Comments)        Medication List     STOP taking these medications    metaxalone 800 MG tablet Commonly known as: SKELAXIN   Phenylephrine-DM-GG 2.11-11-48 MG/5ML Liqd   traMADol 50 MG tablet Commonly known as:  ULTRAM       TAKE these medications    acetaminophen 500 MG tablet Commonly known as: TYLENOL Take 500 mg by mouth every 6 (six) hours as needed.   aspirin EC 81 MG tablet Take 81 mg by mouth daily.   atorvastatin 20 MG tablet Commonly known as: LIPITOR Take 20 mg by mouth daily at 6 PM.   bisacodyl 5 MG EC tablet Commonly known as: DULCOLAX Take 5 mg by mouth daily as needed for moderate constipation.   Calcium 600+D 600-400 MG-UNIT tablet Generic drug: Calcium Carbonate-Vitamin D Take 2 tablets by mouth daily.   cetirizine 10 MG tablet Commonly known as: ZYRTEC Take 10 mg by mouth daily.   enalapril 2.5 MG tablet Commonly known as: VASOTEC Take 1 tablet (2.5 mg total) by mouth daily.   EPINEPHrine 0.3 mg/0.3 mL Soaj injection Commonly known as: EPI-PEN Inject 0.3 mg into the muscle once.   fluticasone 50 MCG/ACT nasal spray Commonly known as: FLONASE Place 2 sprays into both nostrils daily.   furosemide 20 MG tablet Commonly known as: LASIX Take 20 mg by mouth daily.   gabapentin 300 MG capsule Commonly known as: NEURONTIN Take 300-600 mg by mouth at bedtime.   glucosamine-chondroitin 500-400 MG tablet Take 1 tablet by mouth 2 (two) times daily.   loperamide 2 MG tablet Commonly known as: IMODIUM A-D Take 2 tablets (4 mg total) by mouth 4 (four) times daily as needed for diarrhea or loose stools.   Methylcobalamin 1 MG Chew Chew 1 mg by mouth daily.   metoprolol succinate 50 MG 24 hr tablet Commonly known as: TOPROL-XL Take 50 mg by mouth 2 (two) times daily. Take with or immediately following a meal.   multivitamin with minerals tablet Take 1 tablet by mouth daily.   ondansetron 4 MG disintegrating tablet Commonly known as: Zofran ODT Take 1 tablet (4 mg total) by mouth every 8 (eight) hours as needed for nausea or vomiting.   potassium chloride 10 MEQ CR capsule Commonly known as: MICRO-K Take 10 mEq by mouth daily.   sennosides-docusate  sodium 8.6-50 MG tablet Commonly known as: SENOKOT-S Take 1 tablet by mouth daily.   tiZANidine 2 MG tablet Commonly known as: ZANAFLEX Take 2 mg by mouth every 8 (eight) hours as needed for muscle spasms.         Follow-up Information     Laurier Nancy, MD Follow up on 04/13/2022.   Specialty: Cardiology Why: 9 am. Contact information: [address] [place] [address] [phone]                 Allergies  Allergen Reactions   Azithromycin     Other reaction(s): Unknown   Erythromycin Ethylsuccinate     Other reaction(s): Unknown   Penicillin V Potassium     Other reaction(s): Unknown   Penicillins Other (See Comments)    Has patient had a PCN reaction causing immediate rash, facial/tongue/throat swelling, SOB or lightheadedness with hypotension: {yes Has patient had a PCN reaction causing severe rash involving mucus  membranes or skin necrosis: no Has patient had a PCN reaction that required hospitalization:no Has patient had a PCN reaction occurring within the last 10 years: :no If all of the above answers are "NO", then may proceed with Cephalosporin use.    Sulfa Antibiotics Other (See Comments)   Sulfamethoxazole-Trimethoprim Other (See Comments)     The results of significant diagnostics from this hospitalization (including imaging, microbiology, ancillary and laboratory) are listed below for reference.   Consultations:   Procedures/Studies: ECHOCARDIOGRAM COMPLETE  Result Date: 04/07/2022    ECHOCARDIOGRAM REPORT   Patient Name:   SERENITEE FUERTES Date of Exam: 04/07/2022 Medical Rec #:  [id number]         Height:       62.0 in Accession #:    [id number]        Weight:       156.5 lb Date of Birth:  [birth year]         BSA:          1.723 m Patient Age:    88 years          BP:           139/75 mmHg Patient Gender: F                 HR:           88 bpm. Exam Location:  [facility] Procedure: 2D Echo, Cardiac Doppler and Color Doppler Indications:      CHF-acute diastolic I50.31  History:         Patient has no prior history of Echocardiogram examinations.                  Risk Factors:Hypertension.  Sonographer:     Cristela Blue Referring Phys:  [id number] Andris Baumann Diagnosing Phys: Adrian Blackwater IMPRESSIONS  1. Left ventricular ejection fraction, by estimation, is 40 to 45%. The left ventricle has mildly decreased function. The left ventricle demonstrates global hypokinesis. The left ventricular internal cavity size was moderately dilated. There is moderate  concentric left ventricular hypertrophy. Left ventricular diastolic parameters are indeterminate.  2. Right ventricular systolic function is severely reduced. The right ventricular size is severely enlarged. Mildly increased right ventricular wall thickness.  3. Left atrial size was severely dilated.  4. Right atrial size was severely dilated.  5. The mitral valve is abnormal. Moderate to severe mitral valve regurgitation.  6. Tricuspid valve regurgitation is moderate to severe.  7. The aortic valve is calcified. Aortic valve regurgitation is moderate to severe. Aortic valve sclerosis/calcification is present, without any evidence of aortic stenosis. FINDINGS  Left Ventricle: Left ventricular ejection fraction, by estimation, is 40 to 45%. The left ventricle has mildly decreased function. The left ventricle demonstrates global hypokinesis. The left ventricular internal cavity size was moderately dilated. There is moderate concentric left ventricular hypertrophy. Left ventricular diastolic parameters are indeterminate. Right Ventricle: The right ventricular size is severely enlarged. Mildly increased right ventricular wall thickness. Right ventricular systolic function is severely reduced. Left Atrium: Left atrial size was severely dilated. Right Atrium: Right atrial size was severely dilated. Pericardium: There is no evidence of pericardial effusion. Mitral Valve: The mitral valve is abnormal. Moderate to  severe mitral valve regurgitation. Tricuspid Valve: The tricuspid valve is grossly normal. Tricuspid valve regurgitation is moderate to severe. Aortic Valve: The aortic valve is calcified. Aortic valve regurgitation is moderate to severe. Aortic regurgitation PHT measures 438 msec. Aortic valve sclerosis/calcification is present, without any  evidence of aortic stenosis. Aortic valve mean gradient measures 2.0 mmHg. Aortic valve peak gradient measures 4.4 mmHg. Aortic valve area, by VTI measures 3.05 cm. Pulmonic Valve: The pulmonic valve was grossly normal. Pulmonic valve regurgitation is mild. No evidence of pulmonic stenosis. Aorta: The aortic root, ascending aorta and aortic arch are all structurally normal, with no evidence of dilitation or obstruction. IAS/Shunts: No atrial level shunt detected by color flow Doppler. There is no evidence of a patent foramen ovale. There is no evidence of an atrial septal defect.  LEFT VENTRICLE PLAX 2D LVIDd:         4.50 cm     Diastology LVIDs:         3.60 cm     LV e' medial:    13.60 cm/s LV PW:         1.10 cm     LV E/e' medial:  9.5 LV IVS:        0.80 cm     LV e' lateral:   13.10 cm/s LVOT diam:     1.90 cm     LV E/e' lateral: 9.8 LV SV:         52 LV SV Index:   30 LVOT Area:     2.84 cm  LV Volumes (MOD) LV vol d, MOD A2C: 77.2 ml LV vol d, MOD A4C: 85.0 ml LV vol s, MOD A2C: 42.5 ml LV vol s, MOD A4C: 41.1 ml LV SV MOD A2C:     34.7 ml LV SV MOD A4C:     85.0 ml LV SV MOD BP:      43.1 ml RIGHT VENTRICLE RV Basal diam:  4.60 cm RV Mid diam:    4.30 cm RV S prime:     13.40 cm/s TAPSE (M-mode): 1.8 cm LEFT ATRIUM           Index        RIGHT ATRIUM           Index LA diam:      4.30 cm 2.50 cm/m   RA Area:     25.30 cm LA Vol (A2C): 60.5 ml 35.12 ml/m  RA Volume:   83.40 ml  48.42 ml/m LA Vol (A4C): 95.3 ml 55.32 ml/m  AORTIC VALVE                    PULMONIC VALVE AV Area (Vmax):    2.63 cm     PR End Diast Vel: 8.29 msec AV Area (Vmean):   2.32 cm AV  Area (VTI):     3.05 cm AV Vmax:           105.00 cm/s AV Vmean:          71.100 cm/s AV VTI:            0.171 m AV Peak Grad:      4.4 mmHg AV Mean Grad:      2.0 mmHg LVOT Vmax:         97.50 cm/s LVOT Vmean:        58.200 cm/s LVOT VTI:          0.184 m LVOT/AV VTI ratio: 1.08 AI PHT:            438 msec  AORTA Ao Root diam: 3.20 cm MITRAL VALVE                TRICUSPID VALVE MV Area (PHT): 4.31 cm  TR Peak grad:   37.9 mmHg MV Decel Time: 176 msec     TR Vmax:        308.00 cm/s MV E velocity: 129.00 cm/s                             SHUNTS                             Systemic VTI:  0.18 m                             Systemic Diam: 1.90 cm Adrian Blackwater Electronically signed by Adrian Blackwater Signature Date/Time: 04/07/2022/3:26:28 PM    Final    DG Chest 2 View  Result Date: 04/06/2022 CLINICAL DATA:  Chest pain, shortness of breath, cough and history of CHF. EXAM: CHEST - 2 VIEW COMPARISON:  04/02/2016 FINDINGS: Moderate cardiac enlargement. Small left pleural effusion and probable component of mild pulmonary interstitial edema. No pneumothorax or focal airspace consolidation. IMPRESSION: Moderate cardiac enlargement with small left pleural effusion and probable mild pulmonary interstitial edema. Electronically Signed   By: Irish Lack M.D.   On: 04/06/2022 14:15      Labs: BNP (last 3 results) Recent Labs    04/06/22 1345  BNP 651.9*   Basic Metabolic Panel: Recent Labs  Lab 04/06/22 1345 04/07/22 0537 04/08/22 0440  NA 134* 138 131*  K 3.8 3.7 3.5  CL 96* 102 92*  CO2 28 30 28   GLUCOSE 101* 88 103*  BUN 12 14 24*  CREATININE 0.81 0.74 1.10*  CALCIUM 9.4 9.3 9.3  MG  --   --  2.1   Liver Function Tests: No results for input(s): "AST", "ALT", "ALKPHOS", "BILITOT", "PROT", "ALBUMIN" in the last 168 hours. No results for input(s): "LIPASE", "AMYLASE" in the last 168 hours. No results for input(s): "AMMONIA" in the last 168 hours. CBC: Recent Labs  Lab 04/06/22 1345  04/08/22 0440  WBC 7.9 8.9  HGB 10.4* 10.9*  HCT 32.6* 32.9*  MCV 97.6 92.4  PLT 228 231   Cardiac Enzymes: No results for input(s): "CKTOTAL", "CKMB", "CKMBINDEX", "TROPONINI" in the last 168 hours. BNP: Invalid input(s): "POCBNP" CBG: No results for input(s): "GLUCAP" in the last 168 hours. D-Dimer No results for input(s): "DDIMER" in the last 72 hours. Hgb A1c No results for input(s): "HGBA1C" in the last 72 hours. Lipid Profile No results for input(s): "CHOL", "HDL", "LDLCALC", "TRIG", "CHOLHDL", "LDLDIRECT" in the last 72 hours. Thyroid function studies No results for input(s): "TSH", "T4TOTAL", "T3FREE", "THYROIDAB" in the last 72 hours.  Invalid input(s): "FREET3" Anemia work up No results for input(s): "VITAMINB12", "FOLATE", "FERRITIN", "TIBC", "IRON", "RETICCTPCT" in the last 72 hours. Urinalysis    Component Value Date/Time   COLORURINE COLORLESS (A) 02/29/2020 1956   APPEARANCEUR CLEAR (A) 02/29/2020 1956   APPEARANCEUR Cloudy 05/01/2012 2347   LABSPEC 1.002 (L) 02/29/2020 1956   LABSPEC 1.025 05/01/2012 2347   PHURINE 7.0 02/29/2020 1956   GLUCOSEU NEGATIVE 02/29/2020 1956   GLUCOSEU Negative 05/01/2012 2347   HGBUR NEGATIVE 02/29/2020 1956   BILIRUBINUR NEGATIVE 02/29/2020 1956   BILIRUBINUR Negative 05/01/2012 2347   KETONESUR NEGATIVE 02/29/2020 1956   PROTEINUR NEGATIVE 02/29/2020 1956   NITRITE NEGATIVE 02/29/2020 1956   LEUKOCYTESUR NEGATIVE 02/29/2020 1956   LEUKOCYTESUR Negative 05/01/2012 2347   Sepsis Labs Recent  Labs  Lab 04/06/22 1345 04/08/22 0440  WBC 7.9 8.9   Microbiology Recent Results (from the past 240 hour(s))  Resp Panel by RT-PCR (Flu A&B, Covid) Anterior Nasal Swab     Status: None   Collection Time: 04/06/22  1:45 PM   Specimen: Anterior Nasal Swab  Result Value Ref Range Status   SARS Coronavirus 2 by RT PCR NEGATIVE NEGATIVE Final    Comment: (NOTE) SARS-CoV-2 target nucleic acids are NOT DETECTED.  The SARS-CoV-2  RNA is generally detectable in upper respiratory specimens during the acute phase of infection. The lowest concentration of SARS-CoV-2 viral copies this assay can detect is 138 copies/mL. A negative result does not preclude SARS-Cov-2 infection and should not be used as the sole basis for treatment or other patient management decisions. A negative result may occur with  improper specimen collection/handling, submission of specimen other than nasopharyngeal swab, presence of viral mutation(s) within the areas targeted by this assay, and inadequate number of viral copies(<138 copies/mL). A negative result must be combined with clinical observations, patient history, and epidemiological information. The expected result is Negative.  Fact Sheet for Patients:  BloggerCourse.comhttps://www.fda.gov/media/152166/download  Fact Sheet for Healthcare Providers:  SeriousBroker.ithttps://www.fda.gov/media/152162/download  This test is no t yet approved or cleared by the [place] FDA and  has been authorized for detection and/or diagnosis of SARS-CoV-2 by FDA under an Emergency Use Authorization (EUA). This EUA will remain  in effect (meaning this test can be used) for the duration of the COVID-19 declaration under Section 564(b)(1) of the Act, 21 U.S.C.section 360bbb-3(b)(1), unless the authorization is terminated  or revoked sooner.       Influenza A by PCR NEGATIVE NEGATIVE Final   Influenza B by PCR NEGATIVE NEGATIVE Final    Comment: (NOTE) The Xpert Xpress SARS-CoV-2/FLU/RSV plus assay is intended as an aid in the diagnosis of influenza from Nasopharyngeal swab specimens and should not be used as a sole basis for treatment. Nasal washings and aspirates are unacceptable for Xpert Xpress SARS-CoV-2/FLU/RSV testing.  Fact Sheet for Patients: BloggerCourse.comhttps://www.fda.gov/media/152166/download  Fact Sheet for Healthcare Providers: SeriousBroker.ithttps://www.fda.gov/media/152162/download  This test is not yet approved or cleared by the  [place] FDA and has been authorized for detection and/or diagnosis of SARS-CoV-2 by FDA under an Emergency Use Authorization (EUA). This EUA will remain in effect (meaning this test can be used) for the duration of the COVID-19 declaration under Section 564(b)(1) of the Act, 21 U.S.C. section 360bbb-3(b)(1), unless the authorization is terminated or revoked.  Performed at [facility], [address] [place], [place] [id number]      Total time spend on discharging this patient, including the last patient exam, discussing the hospital stay, instructions for ongoing care as it relates to all pertinent caregivers, as well as preparing the medical discharge records, prescriptions, and/or referrals as applicable, is 35 minutes.    Darlin Priestlyina Rebie Peale, MD  [facility] 04/08/2022, 8:17 AM

## 2022-04-08 NOTE — Progress Notes (Signed)
Notified provider of sodium 131, hypotension 90/54, and creatinine 1.10

## 2022-04-11 ENCOUNTER — Encounter: Payer: Self-pay | Admitting: Family

## 2022-04-12 ENCOUNTER — Encounter: Payer: Self-pay | Admitting: Family

## 2022-04-12 ENCOUNTER — Ambulatory Visit: Payer: Medicare PPO | Attending: Family | Admitting: Family

## 2022-04-12 VITALS — BP 119/69 | HR 88 | Resp 18 | Ht 61.0 in | Wt 142.0 lb

## 2022-04-12 DIAGNOSIS — I890 Lymphedema, not elsewhere classified: Secondary | ICD-10-CM | POA: Insufficient documentation

## 2022-04-12 DIAGNOSIS — I10 Essential (primary) hypertension: Secondary | ICD-10-CM

## 2022-04-12 DIAGNOSIS — I110 Hypertensive heart disease with heart failure: Secondary | ICD-10-CM | POA: Diagnosis present

## 2022-04-12 DIAGNOSIS — Z79899 Other long term (current) drug therapy: Secondary | ICD-10-CM | POA: Insufficient documentation

## 2022-04-12 DIAGNOSIS — R0789 Other chest pain: Secondary | ICD-10-CM | POA: Diagnosis not present

## 2022-04-12 DIAGNOSIS — R5383 Other fatigue: Secondary | ICD-10-CM | POA: Insufficient documentation

## 2022-04-12 DIAGNOSIS — I480 Paroxysmal atrial fibrillation: Secondary | ICD-10-CM | POA: Insufficient documentation

## 2022-04-12 DIAGNOSIS — Z23 Encounter for immunization: Secondary | ICD-10-CM | POA: Insufficient documentation

## 2022-04-12 DIAGNOSIS — Z9049 Acquired absence of other specified parts of digestive tract: Secondary | ICD-10-CM | POA: Insufficient documentation

## 2022-04-12 DIAGNOSIS — R0602 Shortness of breath: Secondary | ICD-10-CM | POA: Diagnosis not present

## 2022-04-12 DIAGNOSIS — I5022 Chronic systolic (congestive) heart failure: Secondary | ICD-10-CM | POA: Diagnosis not present

## 2022-04-12 NOTE — Patient Instructions (Signed)
Continue weighing daily and call for an overnight weight gain of 3 pounds or more or a weekly weight gain of more than 5 pounds.  °

## 2022-04-12 NOTE — Progress Notes (Unsigned)
Patient ID: Gina White, female    DOB: [birth year], 88 y.o.   MRN: [id number]  HPI  [name] is a 88 y/o female with a history of  Echo report from 04/07/22 reviewed and showed an EF of 40-45% along with moderate LVH, severe LAE and moderate/ severe MR/TR/AR  Admitted 04/06/22 due to chest tightness and shortness of breath. Given IV lasix with transition to oral diuretics. Cardiology consult obtained. Discharged after 2 days.   She presents today for her initial visit with a chief complaint of moderate fatigue with minimal exertion. Describes this as chronic in nature. Has associated decreased appetite, shortness of breath, pedal edema, palpitations and chronic back pain along with this. She denies any difficulty sleeping, dizziness, abdominal distention, chest pain, cough or weight gain.   Currently wears compression boots twice daily and has been for ~ 15 years with good results.   Past Medical History:  Diagnosis Date   Anemia    Arrhythmia    atrial fibrillation   Arthritis    CHF (congestive heart failure) ([place])    Hypertension    Past Surgical History:  Procedure Laterality Date   ABDOMINAL HYSTERECTOMY     CHOLECYSTECTOMY     JOINT REPLACEMENT     History reviewed. No pertinent family history. Social History   Tobacco Use   Smoking status: Never   Smokeless tobacco: Current    Types: Snuff  Substance Use Topics   Alcohol use: No   Allergies  Allergen Reactions   Azithromycin     Other reaction(s): Unknown   Erythromycin Ethylsuccinate     Other reaction(s): Unknown   Penicillin V Potassium     Other reaction(s): Unknown   Penicillins Other (See Comments)    Has patient had a PCN reaction causing immediate rash, facial/tongue/throat swelling, SOB or lightheadedness with hypotension: {yes Has patient had a PCN reaction causing severe rash involving mucus membranes or skin necrosis: no Has patient had a PCN reaction that required hospitalization:no Has  patient had a PCN reaction occurring within the last 10 years: :no If all of the above answers are "NO", then may proceed with Cephalosporin use.    Sulfa Antibiotics Other (See Comments)   Sulfamethoxazole-Trimethoprim Other (See Comments)   Prior to Admission medications   Medication Sig Start Date End Date Taking? Authorizing Provider  aspirin EC 81 MG tablet Take 81 mg by mouth daily.   Yes [provider]  atorvastatin (LIPITOR) 20 MG tablet Take 20 mg by mouth daily at 6 PM.   Yes [provider]  bisacodyl (DULCOLAX) 5 MG EC tablet Take 5 mg by mouth daily as needed for moderate constipation.   Yes [provider]  Calcium Carbonate-Vitamin D (CALCIUM 600+D) 600-400 MG-UNIT tablet Take 2 tablets by mouth daily.   Yes [provider]  cetirizine (ZYRTEC) 10 MG tablet Take 10 mg by mouth daily.   Yes [provider]  enalapril (VASOTEC) 2.5 MG tablet Take 1 tablet (2.5 mg total) by mouth daily. 04/08/22 07/07/22 Yes Enzo Bi, MD  fluticasone (FLONASE) 50 MCG/ACT nasal spray Place 2 sprays into both nostrils daily.   Yes [provider]  furosemide (LASIX) 20 MG tablet Take 20 mg by mouth daily.   Yes [provider]  gabapentin (NEURONTIN) 300 MG capsule Take 300-600 mg by mouth at bedtime.    Yes [provider]  glucosamine-chondroitin 500-400 MG tablet Take 1 tablet by mouth 2 (two) times daily.  Yes [provider]  loperamide (IMODIUM A-D) 2 MG tablet Take 2 tablets (4 mg total) by mouth 4 (four) times daily as needed for diarrhea or loose stools. 02/29/20  Yes Sharman Cheek, MD  Methylcobalamin 1 MG CHEW Chew 1 mg by mouth daily.   Yes [provider]  metoprolol succinate (TOPROL-XL) 50 MG 24 hr tablet Take 50 mg by mouth 2 (two) times daily. Take with or immediately following a meal.   Yes [provider]  Multiple Vitamins-Minerals (MULTIVITAMIN WITH MINERALS) tablet Take 1 tablet  by mouth daily.   Yes [provider]  potassium chloride (MICRO-K) 10 MEQ CR capsule Take 10 mEq by mouth daily.   Yes [provider]  sennosides-docusate sodium (SENOKOT-S) 8.6-50 MG tablet Take 1 tablet by mouth daily.   Yes [provider]  tiZANidine (ZANAFLEX) 2 MG tablet Take 2 mg by mouth every 8 (eight) hours as needed for muscle spasms. 06/13/21 06/13/22 Yes [provider]  acetaminophen (TYLENOL) 500 MG tablet Take 500 mg by mouth every 6 (six) hours as needed. Patient not taking: Reported on 04/12/2022    [provider]  EPINEPHrine 0.3 mg/0.3 mL IJ SOAJ injection Inject 0.3 mg into the muscle once. Patient not taking: Reported on 04/12/2022    [provider]   Review of Systems  Constitutional:  Positive for appetite change (chronically decrease) and fatigue (easily).  HENT:  Negative for congestion, postnasal drip and sore throat.   Eyes: Negative.   Respiratory:  Positive for shortness of breath (improving). Negative for cough and chest tightness.   Cardiovascular:  Positive for palpitations and leg swelling. Negative for chest pain.  Gastrointestinal:  Negative for abdominal distention and abdominal pain.  Endocrine: Negative.   Genitourinary: Negative.   Musculoskeletal:  Positive for back pain. Negative for neck pain.  Skin: Negative.   Allergic/Immunologic: Negative.   Neurological:  Negative for dizziness and light-headedness.  Hematological:  Negative for adenopathy. Bruises/bleeds easily.  Psychiatric/Behavioral:  Negative for dysphoric mood and sleep disturbance (sleeping on 1 pillow). The patient is not nervous/anxious.     Vitals:   04/12/22 1431  BP: 119/69  Pulse: 88  Resp: 18  SpO2: 97%  Weight: 142 lb (64.4 kg)  Height: 5\' 1"  (1.549 m)   Wt Readings from Last 3 Encounters:  04/12/22 142 lb (64.4 kg)  04/06/22 156 lb 8.4 oz (71 kg)  02/29/20 156 lb 8.4 oz (71 kg)   Lab Results  Component Value  Date   CREATININE 1.10 (H) 04/08/2022   CREATININE 0.74 04/07/2022   CREATININE 0.81 04/06/2022   Physical Exam Vitals and nursing note reviewed. Exam conducted with a chaperone present (daughters).  Constitutional:      Appearance: Normal appearance.  HENT:     Head: Normocephalic and atraumatic.  Cardiovascular:     Rate and Rhythm: Normal rate. Rhythm irregular.  Pulmonary:     Effort: Pulmonary effort is normal. No respiratory distress.     Breath sounds: No wheezing or rales.  Abdominal:     General: There is no distension.     Palpations: Abdomen is soft.     Tenderness: There is no abdominal tenderness.  Musculoskeletal:        General: No tenderness.     Cervical back: Normal range of motion and neck supple.     Right lower leg: Edema (trace pitting) present.     Left lower leg: Edema (trace pitting) present.  Skin:  General: Skin is warm and dry.  Neurological:     General: No focal deficit present.     Mental Status: She is alert and oriented to person, place, and time.  Psychiatric:        Mood and Affect: Mood normal.        Behavior: Behavior normal.        Thought Content: Thought content normal.     Assessment & Plan:  1: Chronic heart failure with reduced ejection fraction- - NYHA class III - euvolemic today - weighing daily; reminded to call for an overnight weight gain of > 2 pounds or a weekly weight gain of > 5 pounds - not adding salt and they are trying to read food labels closely for sodium content; she says that prior to her recent admission, she added a lot of salt - trying to drink enough fluids and explained that she needed to drink 60-64 ounces daily but to drink at least 50 ounces if she can't reach the 60 ounces - on GDMT of enalapril and metoprolol - discussed other GDMT to add as we go - BNP 04/06/22 was 651.9 - reports receiving her flu vaccine  2: HTN- - BP looks good (119/69) - saw PCP Dareen Piano) 11/29/21 - BMP 04/08/22 reviewed  and showed sodium 131, potassium 3.5, creatinine 1.1 & GFR 46  3: PAF- - rate controlled on metoprolol - sees cardiology Welton Flakes) tomorrow  4: Lymphedema- - stage 2 - has been wearing compression boots for the last 15 years with good results   Medication list reviewed.   Return in 6 weeks, sooner if needed.

## 2022-05-25 ENCOUNTER — Ambulatory Visit: Payer: Medicare PPO | Attending: Family | Admitting: Family

## 2022-05-25 ENCOUNTER — Encounter: Payer: Self-pay | Admitting: Family

## 2022-05-25 VITALS — BP 132/73 | HR 88 | Resp 16 | Wt 144.0 lb

## 2022-05-25 DIAGNOSIS — I890 Lymphedema, not elsewhere classified: Secondary | ICD-10-CM | POA: Diagnosis not present

## 2022-05-25 DIAGNOSIS — Z79899 Other long term (current) drug therapy: Secondary | ICD-10-CM | POA: Diagnosis not present

## 2022-05-25 DIAGNOSIS — I480 Paroxysmal atrial fibrillation: Secondary | ICD-10-CM | POA: Insufficient documentation

## 2022-05-25 DIAGNOSIS — I10 Essential (primary) hypertension: Secondary | ICD-10-CM

## 2022-05-25 DIAGNOSIS — I110 Hypertensive heart disease with heart failure: Secondary | ICD-10-CM | POA: Diagnosis present

## 2022-05-25 DIAGNOSIS — I5022 Chronic systolic (congestive) heart failure: Secondary | ICD-10-CM | POA: Diagnosis not present

## 2022-05-25 MED ORDER — ENALAPRIL MALEATE 2.5 MG PO TABS: 2.5000 mg | ORAL_TABLET | Freq: Every day | ORAL | 5 refills | Status: DC

## 2022-05-25 NOTE — Patient Instructions (Signed)
Continue weighing daily and call for an overnight weight gain of 3 pounds or more or a weekly weight gain of more than 5 pounds.   If you have voicemail, please make sure your mailbox is cleaned out so that we may leave a message and please make sure to listen to any voicemails.     

## 2022-05-25 NOTE — Progress Notes (Signed)
Patient ID: Gina White, female    DOB: 1924-05-22, 88 y.o.   MRN: [id number]  HPI  Gina White is a 88 y/o female with a history of HTN, atrial fibrillation, anemia and chronic heart failure.   Echo report from 04/07/22 reviewed and showed an EF of 40-45% along with moderate LVH, severe LAE and moderate/ severe MR/TR/AR  Admitted 04/06/22 due to chest tightness and shortness of breath. Given IV lasix with transition to oral diuretics. Cardiology consult obtained. Discharged after 2 days.   Gina White presents today for a follow-up visit with a chief complaint of moderate fatigue with minimal exertion. Describes this as chronic in nature having been present for several years. Gina White has associated light-headedness, pedal edema and back pain along with this. Denies any difficulty sleeping, abdominal distention, palpitations, chest pain, shortness of breath, cough or weight gain.  Currently wears compression boots twice daily and has been for ~ 15 years with good results.   Does have a small wound on her left shin that isn't healing well.   Past Medical History:  Diagnosis Date   Anemia    Arrhythmia    atrial fibrillation   Arthritis    CHF (congestive heart failure) ([facility])    Hypertension    Past Surgical History:  Procedure Laterality Date   ABDOMINAL HYSTERECTOMY     CHOLECYSTECTOMY     JOINT REPLACEMENT     No family history on file. Social History   Tobacco Use   Smoking status: Never   Smokeless tobacco: Current    Types: Snuff  Substance Use Topics   Alcohol use: No   Allergies  Allergen Reactions   Azithromycin     Other reaction(s): Unknown   Erythromycin Ethylsuccinate     Other reaction(s): Unknown   Penicillin V Potassium     Other reaction(s): Unknown   Penicillins Other (See Comments)    Has patient had a PCN reaction causing immediate rash, facial/tongue/throat swelling, SOB or lightheadedness with hypotension: {yes Has patient had a PCN reaction causing  severe rash involving mucus membranes or skin necrosis: no Has patient had a PCN reaction that required hospitalization:no Has patient had a PCN reaction occurring within the last 10 years: :no If all of the above answers are "NO", then may proceed with Cephalosporin use.    Sulfa Antibiotics Other (See Comments)   Sulfamethoxazole-Trimethoprim Other (See Comments)   Prior to Admission medications   Medication Sig Start Date End Date Taking? Authorizing Provider  acetaminophen (TYLENOL) 500 MG tablet Take 500 mg by mouth every 6 (six) hours as needed.   Yes [provider]  aspirin EC 81 MG tablet Take 81 mg by mouth daily.   Yes [provider]  atorvastatin (LIPITOR) 20 MG tablet Take 20 mg by mouth daily at 6 PM.   Yes [provider]  bisacodyl (DULCOLAX) 5 MG EC tablet Take 5 mg by mouth daily as needed for moderate constipation.   Yes [provider]  Calcium Carbonate-Vitamin D (CALCIUM 600+D) 600-400 MG-UNIT tablet Take 2 tablets by mouth daily.   Yes [provider]  cetirizine (ZYRTEC) 10 MG tablet Take 10 mg by mouth daily.   Yes [provider]  fluticasone (FLONASE) 50 MCG/ACT nasal spray Place 2 sprays into both nostrils daily.   Yes [provider]  furosemide (LASIX) 20 MG tablet Take 20 mg by mouth daily.   Yes [provider]  gabapentin (NEURONTIN) 300 MG capsule Take 300-600 mg  by mouth at bedtime.    Yes [provider]  glucosamine-chondroitin 500-400 MG tablet Take 1 tablet by mouth 2 (two) times daily.   Yes [provider]  loperamide (IMODIUM A-D) 2 MG tablet Take 2 tablets (4 mg total) by mouth 4 (four) times daily as needed for diarrhea or loose stools. 02/29/20  Yes Carrie Mew, MD  Methylcobalamin 1 MG CHEW Chew 1 mg by mouth daily.   Yes [provider]  metoprolol succinate (TOPROL-XL) 50 MG 24 hr tablet Take 50 mg by mouth 2 (two) times daily. Take with or  immediately following a meal.   Yes [provider]  Multiple Vitamins-Minerals (MULTIVITAMIN WITH MINERALS) tablet Take 1 tablet by mouth daily.   Yes [provider]  potassium chloride (MICRO-K) 10 MEQ CR capsule Take 10 mEq by mouth daily.   Yes [provider]  sennosides-docusate sodium (SENOKOT-S) 8.6-50 MG tablet Take 1 tablet by mouth daily.   Yes [provider]  tiZANidine (ZANAFLEX) 2 MG tablet Take 2 mg by mouth every 8 (eight) hours as needed for muscle spasms. 06/13/21 06/13/22 Yes [provider]  enalapril (VASOTEC) 2.5 MG tablet Take 1 tablet (2.5 mg total) by mouth daily. 05/25/22   Darylene Price A, FNP  EPINEPHrine 0.3 mg/0.3 mL IJ SOAJ injection Inject 0.3 mg into the muscle once. Patient not taking: Reported on 04/12/2022    [provider]   Review of Systems  Constitutional:  Positive for appetite change (chronically decrease) and fatigue (easily).  HENT:  Negative for congestion, postnasal drip and sore throat.   Eyes: Negative.   Respiratory:  Negative for cough, chest tightness and shortness of breath.   Cardiovascular:  Positive for leg swelling. Negative for chest pain and palpitations.  Gastrointestinal:  Negative for abdominal distention and abdominal pain.  Endocrine: Negative.   Genitourinary: Negative.   Musculoskeletal:  Positive for back pain. Negative for neck pain.  Skin:  Positive for wound (left shin).  Allergic/Immunologic: Negative.   Neurological:  Positive for light-headedness. Negative for dizziness.  Hematological:  Negative for adenopathy. Bruises/bleeds easily.  Psychiatric/Behavioral:  Negative for dysphoric mood and sleep disturbance (sleeping on 1 pillow). The patient is not nervous/anxious.    Vitals:   05/25/22 1402  BP: 132/73  Pulse: 88  Resp: 16  SpO2: 98%  Weight: 144 lb (65.3 kg)   Wt Readings from Last 3 Encounters:  05/25/22 144 lb (65.3 kg)  04/12/22 142 lb (64.4 kg)   04/06/22 156 lb 8.4 oz (71 kg)   Lab Results  Component Value Date   CREATININE 1.10 (H) 04/08/2022   CREATININE 0.74 04/07/2022   CREATININE 0.81 04/06/2022   Physical Exam Vitals and nursing note reviewed. Exam conducted with a chaperone present (daughters).  Constitutional:      Appearance: Normal appearance.  HENT:     Head: Normocephalic and atraumatic.  Cardiovascular:     Rate and Rhythm: Normal rate. Rhythm irregular.  Pulmonary:     Effort: Pulmonary effort is normal. No respiratory distress.     Breath sounds: No wheezing or rales.  Abdominal:     General: There is no distension.     Palpations: Abdomen is soft.     Tenderness: There is no abdominal tenderness.  Musculoskeletal:        General: No tenderness.     Cervical back: Normal range of motion and neck supple.     Right lower leg: Edema (trace pitting) present.  Left lower leg: Edema (trace pitting) present.  Skin:    General: Skin is warm and dry.     Comments: Nickel sized open wound on the left shin  Neurological:     General: No focal deficit present.     Mental Status: Gina White is alert and oriented to person, place, and time.  Psychiatric:        Mood and Affect: Mood normal.        Behavior: Behavior normal.        Thought Content: Thought content normal.   Assessment & Plan:  1: Chronic heart failure with reduced ejection fraction- - NYHA class III - euvolemic today - weighing daily; reminded to call for an overnight weight gain of > 2 pounds or a weekly weight gain of > 5 pounds - weight up 2 pounds from last visit here 6 weeks ago - not adding salt and they are trying to read food labels closely for sodium content - trying to drink enough fluids and explained that Gina White needed to drink 60-64 ounces daily but to drink at least 50 ounces if Gina White can't reach the 60 ounces - on GDMT of enalapril and metoprolol - discussed other GDMT to add as we go but will not adjust anything at this time - BNP  04/06/22 was 651.9 - reports receiving her flu vaccine  2: HTN- - BP looks good (132/73) - saw PCP Ouida Sills) 11/29/21 - BMP 04/08/22 reviewed and showed sodium 131, potassium 3.5, creatinine 1.1 & GFR 46  3: PAF- - rate controlled on metoprolol - sees cardiology Humphrey Rolls)   4: Lymphedema- - stage 2 - has been wearing compression boots for the last 15 years with good results - can use neosporin on open wound and leave open to air - follow with PCP if it continues to not heal   Medication list reviewed.   Return in 2 months, sooner if needed.  )

## 2022-05-27 ENCOUNTER — Encounter: Payer: Self-pay | Admitting: Family

## 2022-06-19 ENCOUNTER — Ambulatory Visit
Admission: RE | Admit: 2022-06-19 | Discharge: 2022-06-19 | Disposition: A | Discharge status: Home / Self Care | Payer: Medicare PPO | Source: Ambulatory Visit

## 2022-06-19 VITALS — BP 109/69 | HR 77 | Temp 97.8°F | Resp 20

## 2022-06-19 DIAGNOSIS — D649 Anemia, unspecified: Secondary | ICD-10-CM

## 2022-06-19 DIAGNOSIS — S81802A Unspecified open wound, left lower leg, initial encounter: Secondary | ICD-10-CM

## 2022-06-19 DIAGNOSIS — L03116 Cellulitis of left lower limb: Secondary | ICD-10-CM | POA: Diagnosis not present

## 2022-06-19 LAB — COMPREHENSIVE METABOLIC PANEL
ALT: 13 U/L (ref 0–44)
AST: 24 U/L (ref 15–41)
Albumin: 3.6 g/dL (ref 3.5–5.0)
Alkaline Phosphatase: 64 U/L (ref 38–126)
Anion gap: 7 (ref 5–15)
BUN: 21 mg/dL (ref 8–23)
CO2: 25 mmol/L (ref 22–32)
Calcium: 8.6 mg/dL — ABNORMAL LOW (ref 8.9–10.3)
Chloride: 103 mmol/L (ref 98–111)
Creatinine, Ser: 0.9 mg/dL (ref 0.44–1.00)
GFR, Estimated: 59 mL/min — ABNORMAL LOW (ref >60)
Glucose, Bld: 109 mg/dL — ABNORMAL HIGH (ref 70–99)
Potassium: 4.3 mmol/L (ref 3.5–5.1)
Sodium: 135 mmol/L (ref 135–145)
Total Bilirubin: 0.4 mg/dL (ref 0.3–1.2)
Total Protein: 6.5 g/dL (ref 6.5–8.1)

## 2022-06-19 LAB — CBC WITH DIFFERENTIAL/PLATELET
Abs Immature Granulocytes: 0.08 10*3/uL — ABNORMAL HIGH (ref 0.00–0.07)
Basophils Absolute: 0.1 10*3/uL (ref 0.0–0.1)
Basophils Relative: 1 %
Eosinophils Absolute: 0.2 10*3/uL (ref 0.0–0.5)
Eosinophils Relative: 2 %
HCT: 29.2 % — ABNORMAL LOW (ref 36.0–46.0)
Hemoglobin: 9.4 g/dL — ABNORMAL LOW (ref 12.0–15.0)
Immature Granulocytes: 1 %
Lymphocytes Relative: 21 %
Lymphs Abs: 1.7 10*3/uL (ref 0.7–4.0)
MCH: 31.3 pg (ref 26.0–34.0)
MCHC: 32.2 g/dL (ref 30.0–36.0)
MCV: 97.3 fL (ref 80.0–100.0)
Monocytes Absolute: 0.9 10*3/uL (ref 0.1–1.0)
Monocytes Relative: 11 %
Neutro Abs: 5.1 10*3/uL (ref 1.7–7.7)
Neutrophils Relative %: 64 %
Platelets: 211 10*3/uL (ref 150–400)
RBC: 3 MIL/uL — ABNORMAL LOW (ref 3.87–5.11)
RDW: 14.2 % (ref 11.5–15.5)
WBC: 8 10*3/uL (ref 4.0–10.5)
nRBC: 0 % (ref 0.0–0.2)

## 2022-06-19 MED ORDER — MUPIROCIN 2 % EX OINT: 1.0000 | TOPICAL_OINTMENT | Freq: Two times a day (BID) | CUTANEOUS | 0 refills | Status: DC

## 2022-06-19 MED ORDER — DOXYCYCLINE HYCLATE 100 MG PO CAPS: 100.0000 mg | ORAL_CAPSULE | Freq: Two times a day (BID) | ORAL | 0 refills | Status: DC

## 2022-06-19 NOTE — ED Triage Notes (Signed)
Pt developed a wound on her left shin at the end of October. She has been seen twice by her PCP and the wound is not getting any better.

## 2022-06-19 NOTE — ED Provider Notes (Signed)
[facility]    CSN: [id number] Arrival date & time: 06/19/22  1040      History   Chief Complaint Chief Complaint  Patient presents with   Wound Check    Sore on leg seen by doctor but getting worse. - Entered by patient    HPI Gina White is a 88 y.o. female.   Patient presents today companied by her daughter who provides majority of history.  Reports a 6 to 8-week history of wound on her left lower leg.  Initially she was cleaning this with hydrogen peroxide and applying Neosporin.  She was then evaluated by her PCP who recommended Polysporin and to stop using hydrogen peroxide.  Despite adhering to these recommendations wound has continued to enlarge.  She denies any significant pain, fever, erythema.  Does report associated swelling.  She denies history of recurrent skin infections or MRSA.  She has not taken any antibiotics recently.  She has not seen wound care in the past.  She denies any fever, nausea, vomiting, weakness, lethargy.    Past Medical History:  Diagnosis Date   Anemia    Arrhythmia    atrial fibrillation   Arthritis    CHF (congestive heart failure) (HCC)    Hypertension     Patient Active Problem List   Diagnosis Date Noted   Acute systolic CHF (congestive heart failure) (HCC) 04/06/2022   Chronic anemia 04/06/2022   Atypical chest pain 04/06/2022   Pleural effusion on left 04/06/2022   Lymphedema 01/17/2017   Chronic venous insufficiency 01/17/2017   Essential hypertension 01/17/2017   Hyperlipidemia 01/17/2017   GERD (gastroesophageal reflux disease) 01/17/2017   Paroxysmal atrial fibrillation (HCC) 10/19/2013    Past Surgical History:  Procedure Laterality Date   ABDOMINAL HYSTERECTOMY     CHOLECYSTECTOMY     JOINT REPLACEMENT      OB History   No obstetric history on file.      Home Medications    Prior to Admission medications   Medication Sig Start Date End Date Taking? Authorizing Provider  doxycycline  (VIBRAMYCIN) 100 MG capsule Take 1 capsule (100 mg total) by mouth 2 (two) times daily. 06/19/22  Yes Berton Butrick, Denny Peon K, PA-C  mupirocin ointment (BACTROBAN) 2 % Apply 1 Application topically 2 (two) times daily. 06/19/22  Yes Jamarius Saha K, PA-C  ofloxacin (OCUFLOX) 0.3 % ophthalmic solution Apply to eye. 05/24/22  Yes [provider]  traMADol (ULTRAM) 50 MG tablet Take by mouth. 04/24/22  Yes [provider]  acetaminophen (TYLENOL) 500 MG tablet Take 500 mg by mouth every 6 (six) hours as needed.    [provider]  aspirin EC 81 MG tablet Take 81 mg by mouth daily.    [provider]  atorvastatin (LIPITOR) 20 MG tablet Take 20 mg by mouth daily at 6 PM.    [provider]  bisacodyl (DULCOLAX) 5 MG EC tablet Take 5 mg by mouth daily as needed for moderate constipation.    [provider]  Calcium Carbonate-Vitamin D (CALCIUM 600+D) 600-400 MG-UNIT tablet Take 2 tablets by mouth daily.    [provider]  cetirizine (ZYRTEC) 10 MG tablet Take 10 mg by mouth daily.    [provider]  enalapril (VASOTEC) 2.5 MG tablet Take 1 tablet (2.5 mg total) by mouth daily. 05/25/22   Clarisa Kindred A, FNP  EPINEPHrine 0.3 mg/0.3 mL IJ SOAJ injection Inject 0.3 mg into the muscle once. Patient not taking: Reported on  04/12/2022    [provider]  fluticasone (FLONASE) 50 MCG/ACT nasal spray Place 2 sprays into both nostrils daily.    [provider]  furosemide (LASIX) 20 MG tablet Take 20 mg by mouth daily.    [provider]  gabapentin (NEURONTIN) 300 MG capsule Take 300-600 mg by mouth at bedtime.     [provider]  glucosamine-chondroitin 500-400 MG tablet Take 1 tablet by mouth 2 (two) times daily.    [provider]  loperamide (IMODIUM A-D) 2 MG tablet Take 2 tablets (4 mg total) by mouth 4 (four) times daily as needed for diarrhea or loose stools. 02/29/20   Carrie Mew, MD   Methylcobalamin 1 MG CHEW Chew 1 mg by mouth daily.    [provider]  metoprolol succinate (TOPROL-XL) 50 MG 24 hr tablet Take 50 mg by mouth 2 (two) times daily. Take with or immediately following a meal.    [provider]  Multiple Vitamins-Minerals (MULTIVITAMIN WITH MINERALS) tablet Take 1 tablet by mouth daily.    [provider]  potassium chloride (MICRO-K) 10 MEQ CR capsule Take 10 mEq by mouth daily.    [provider]  sennosides-docusate sodium (SENOKOT-S) 8.6-50 MG tablet Take 1 tablet by mouth daily.    [provider]    Family History History reviewed. No pertinent family history.  Social History Social History   Tobacco Use   Smoking status: Never   Smokeless tobacco: Current    Types: Snuff  Vaping Use   Vaping Use: Never used  Substance Use Topics   Alcohol use: No   Drug use: Never     Allergies   Azithromycin, Erythromycin ethylsuccinate, Penicillin v potassium, Penicillins, Sulfa antibiotics, and Sulfamethoxazole-trimethoprim   Review of Systems Review of Systems  Constitutional:  Positive for activity change. Negative for appetite change, fatigue and fever.  Gastrointestinal:  Negative for abdominal pain, diarrhea, nausea and vomiting.  Musculoskeletal:  Negative for arthralgias and myalgias.  Skin:  Positive for color change and wound.  Neurological:  Negative for dizziness, weakness, light-headedness, numbness and headaches.     Physical Exam Triage Vital Signs ED Triage Vitals  Enc Vitals Group     BP 06/19/22 1110 (!) 94/57     Pulse Rate 06/19/22 1110 86     Resp 06/19/22 1110 16     Temp 06/19/22 1110 97.8 F (36.6 C)     Temp Source 06/19/22 1110 Oral     SpO2 06/19/22 1110 100 %     Weight --      Height --      Head Circumference --      Peak Flow --      Pain Score 06/19/22 1108 0     Pain Loc --      Pain Edu? --      Excl. in [place]? --    No data found.  Updated Vital  Signs BP 109/69 (BP Location: Left Arm)   Pulse 77   Temp 97.8 F (36.6 C) (Oral)   Resp 20   SpO2 98%   Visual Acuity Right Eye Distance:   Left Eye Distance:   Bilateral Distance:    Right Eye Near:   Left Eye Near:    Bilateral Near:     Physical Exam Vitals reviewed.  Constitutional:      General: She is awake. She is not in acute distress.    Appearance: Normal appearance. She is well-developed. She is  not ill-appearing.     Comments: Very pleasant female presented age no acute distress sitting comfortably in wheelchair  HENT:     Head: Normocephalic and atraumatic.  Cardiovascular:     Rate and Rhythm: Normal rate and regular rhythm.     Heart sounds: Normal heart sounds, S1 normal and S2 normal. No murmur heard. Pulmonary:     Effort: Pulmonary effort is normal.     Breath sounds: Normal breath sounds. No wheezing, rhonchi or rales.  Musculoskeletal:     Right lower leg: No edema.     Left lower leg: 2+ Edema present.  Skin:    Findings: Erythema and wound present.       Psychiatric:        Behavior: Behavior is cooperative.      UC Treatments / Results  Labs (all labs ordered are listed, but only abnormal results are displayed) Labs Reviewed  COMPREHENSIVE METABOLIC PANEL - Abnormal; Notable for the following components:      Result Value   Glucose, Bld 109 (*)    Calcium 8.6 (*)    GFR, Estimated 59 (*)    All other components within normal limits  CBC WITH DIFFERENTIAL/PLATELET - Abnormal; Notable for the following components:   RBC 3.00 (*)    Hemoglobin 9.4 (*)    HCT 29.2 (*)    Abs Immature Granulocytes 0.08 (*)    All other components within normal limits    EKG   Radiology No results found.  Procedures Procedures (including critical care time)  Medications Ordered in UC Medications - No data to display  Initial Impression / Assessment and Plan / UC Course  I have reviewed the triage vital signs and the nursing  notes.  Pertinent labs & imaging results that were available during my care of the patient were reviewed by me and considered in my medical decision making (see chart for details).     Patient was initially noted to be hypotensive.  This resolved during visit but basic labs were obtained including CBC which showed a slightly worsening anemia with hemoglobin of 9.4 decreased from baseline of 10-11.  No significant leukocytosis was noted.  She denied any increased shortness of breath, dizziness, fatigue, malaise.  On recheck this normalized to her baseline blood pressure.  Recommended that she monitor this closely at home and if she develops any significant symptoms she needs to go to the emergency room to which she and daughter expressed understanding.  Concern for secondary bacterial infection given worsening wound.  Will start doxycycline twice daily for 10 days.  She was encouraged to keep area clean with soap and water and apply Bactroban ointment twice daily.  She is to keep this elevated.  Recommend close follow-up with her primary care as if symptoms are not improving quickly with medication she would likely benefit from seeing wound care specialist which could be arranged by her PCP.  Discussed that if anything changes and she has fever, nausea, vomiting, spread of redness, drainage she should be reevaluated.  Strict return precautions given to which she and daughter expressed understanding.  Final Clinical Impressions(s) / UC Diagnoses   Final diagnoses:  Wound of left lower extremity, initial encounter  Cellulitis of leg, left  Chronic anemia     Discharge Instructions      Please follow-up with your primary care provider as soon as possible as we discussed.  Your anemia is slightly worse than your baseline so I would like you to  follow-up with them to have this rechecked.  If you develop any dizziness, shortness of breath, heart racing, fatigue you need to go to the emergency  room.  I am concerned that you are developing an infection in your leg.  Please start doxycycline 100 mg twice daily for 10 days.  Keep the area clean with soap and water and apply mupirocin ointment.  If this worsens or changes in any way you need to be seen immediately.  As we discussed, you should follow-up with your primary care soon as possible.     ED Prescriptions     Medication Sig Dispense Auth. Provider   mupirocin ointment (BACTROBAN) 2 % Apply 1 Application topically 2 (two) times daily. 22 g Jesalyn Finazzo K, PA-C   doxycycline (VIBRAMYCIN) 100 MG capsule Take 1 capsule (100 mg total) by mouth 2 (two) times daily. 20 capsule Uriyah Raska, Derry Skill, PA-C      PDMP not reviewed this encounter.   Terrilee Croak, PA-C 06/19/22 1239

## 2022-06-19 NOTE — Discharge Instructions (Addendum)
Please follow-up with your primary care provider as soon as possible as we discussed.  Your anemia is slightly worse than your baseline so I would like you to follow-up with them to have this rechecked.  If you develop any dizziness, shortness of breath, heart racing, fatigue you need to go to the emergency room.  I am concerned that you are developing an infection in your leg.  Please start doxycycline 100 mg twice daily for 10 days.  Keep the area clean with soap and water and apply mupirocin ointment.  If this worsens or changes in any way you need to be seen immediately.  As we discussed, you should follow-up with your primary care soon as possible.

## 2022-07-08 ENCOUNTER — Ambulatory Visit: Payer: Medicare PPO

## 2022-07-24 NOTE — Progress Notes (Signed)
Patient ID: Gina White, female    DOB: 1924/05/28, 89 y.o.   MRN: [id number]  HPI  Gina White is a 89 y/o female with a history of HTN, atrial fibrillation, anemia and chronic heart failure.   Echo report from 04/07/22 reviewed and showed an EF of 40-45% along with moderate LVH, severe LAE and moderate/ severe MR/TR/AR  Was in the ED 06/19/22 due to lower extremity wound and cellulitis. Admitted 04/06/22 due to chest tightness and shortness of breath. Given IV lasix with transition to oral diuretics. Cardiology consult obtained. Discharged after 2 days.   She presents today for a follow-up visit with a chief complaint of moderate fatigue with minimal exertion. Describes this as chronic in nature. Has associated pedal edema and easy bruising along with this. Denies any difficulty sleeping, abdominal distention, palpitations, chest pain, shortness of breath, cough, dizziness or weight gain.   Has changed cardiologist's and is now seeing Dr. Clayborn Bigness. No longer taking enalapril because she says that her BP had gotten too low.   Past Medical History:  Diagnosis Date   Anemia    Arrhythmia    atrial fibrillation   Arthritis    CHF (congestive heart failure) ([place])    Hypertension    Past Surgical History:  Procedure Laterality Date   ABDOMINAL HYSTERECTOMY     CHOLECYSTECTOMY     JOINT REPLACEMENT     No family history on file. Social History   Tobacco Use   Smoking status: Never   Smokeless tobacco: Current    Types: Snuff  Substance Use Topics   Alcohol use: No   Allergies  Allergen Reactions   Azithromycin     Other reaction(s): Unknown   Erythromycin Ethylsuccinate     Other reaction(s): Unknown   Penicillin V Potassium     Other reaction(s): Unknown   Penicillins Other (See Comments)    Has patient had a PCN reaction causing immediate rash, facial/tongue/throat swelling, SOB or lightheadedness with hypotension: {yes Has patient had a PCN reaction causing severe  rash involving mucus membranes or skin necrosis: no Has patient had a PCN reaction that required hospitalization:no Has patient had a PCN reaction occurring within the last 10 years: :no If all of the above answers are "NO", then may proceed with Cephalosporin use.    Sulfa Antibiotics Other (See Comments)   Sulfamethoxazole-Trimethoprim Other (See Comments)   Prior to Admission medications   Medication Sig Start Date End Date Taking? Authorizing Provider  acetaminophen (TYLENOL) 500 MG tablet Take 500 mg by mouth every 6 (six) hours as needed.   Yes [provider]  aspirin EC 81 MG tablet Take 81 mg by mouth daily.   Yes [provider]  atorvastatin (LIPITOR) 20 MG tablet Take 20 mg by mouth daily at 6 PM.   Yes [provider]  bisacodyl (DULCOLAX) 5 MG EC tablet Take 5 mg by mouth daily as needed for moderate constipation.   Yes [provider]  Calcium Carbonate-Vitamin D (CALCIUM 600+D) 600-400 MG-UNIT tablet Take 2 tablets by mouth daily.   Yes [provider]  cetirizine (ZYRTEC) 10 MG tablet Take 10 mg by mouth daily.   Yes [provider]  fluticasone (FLONASE) 50 MCG/ACT nasal spray Place 2 sprays into both nostrils daily.   Yes [provider]  furosemide (LASIX) 20 MG tablet Take 20 mg by mouth daily.   Yes [provider]  gabapentin (NEURONTIN) 300 MG capsule Take 300-600 mg by mouth  at bedtime.    Yes [provider]  glucosamine-chondroitin 500-400 MG tablet Take 1 tablet by mouth 2 (two) times daily.   Yes [provider]  metoprolol succinate (TOPROL-XL) 50 MG 24 hr tablet Take 50 mg by mouth 2 (two) times daily. Take with or immediately following a meal.   Yes [provider]  Multiple Vitamins-Minerals (MULTIVITAMIN WITH MINERALS) tablet Take 1 tablet by mouth daily.   Yes [provider]  mupirocin ointment (BACTROBAN) 2 % Apply 1 Application topically 2 (two) times  daily. 06/19/22  Yes Raspet, Erin K, PA-C  potassium chloride (MICRO-K) 10 MEQ CR capsule Take 10 mEq by mouth daily.   Yes [provider]  traMADol (ULTRAM) 50 MG tablet Take by mouth. 04/24/22  Yes [provider]  enalapril (VASOTEC) 2.5 MG tablet Take 1 tablet (2.5 mg total) by mouth daily. Patient not taking: Reported on 07/26/2022 05/25/22   Darylene Price A, FNP  EPINEPHrine 0.3 mg/0.3 mL IJ SOAJ injection Inject 0.3 mg into the muscle once. Patient not taking: Reported on 04/12/2022    [provider]  Methylcobalamin 1 MG CHEW Chew 1 mg by mouth daily. Patient not taking: Reported on 07/26/2022    [provider]  ofloxacin (OCUFLOX) 0.3 % ophthalmic solution Apply to eye. Patient not taking: Reported on 07/26/2022 05/24/22   [provider]  sennosides-docusate sodium (SENOKOT-S) 8.6-50 MG tablet Take 1 tablet by mouth daily. Patient not taking: Reported on 07/26/2022    [provider]   Review of Systems  Constitutional:  Positive for appetite change (chronically decrease) and fatigue (easily).  HENT:  Negative for congestion, postnasal drip and sore throat.   Eyes: Negative.   Respiratory:  Negative for cough, chest tightness and shortness of breath.   Cardiovascular:  Positive for leg swelling. Negative for chest pain and palpitations.  Gastrointestinal:  Negative for abdominal distention and abdominal pain.  Endocrine: Negative.   Genitourinary: Negative.   Musculoskeletal:  Positive for back pain. Negative for neck pain.  Skin:  Positive for wound (left shin).  Allergic/Immunologic: Negative.   Neurological:  Negative for dizziness and light-headedness.  Hematological:  Negative for adenopathy. Bruises/bleeds easily.  Psychiatric/Behavioral:  Negative for dysphoric mood and sleep disturbance (sleeping on 1 pillow). The patient is not nervous/anxious.    Vitals:   07/26/22 1254  BP: 124/66  Pulse: 85  Resp: 16  SpO2:  99%  Weight: 146 lb 8 oz (66.5 kg)   Wt Readings from Last 3 Encounters:  07/26/22 146 lb 8 oz (66.5 kg)  05/25/22 144 lb (65.3 kg)  04/12/22 142 lb (64.4 kg)   Lab Results  Component Value Date   CREATININE 0.90 06/19/2022   CREATININE 1.10 (H) 04/08/2022   CREATININE 0.74 04/07/2022   Physical Exam Vitals and nursing note reviewed. Exam conducted with a chaperone present (daughter).  Constitutional:      Appearance: Normal appearance.  HENT:     Head: Normocephalic and atraumatic.  Cardiovascular:     Rate and Rhythm: Normal rate. Rhythm irregular.  Pulmonary:     Effort: Pulmonary effort is normal. No respiratory distress.     Breath sounds: No wheezing or rales.  Abdominal:     General: There is no distension.     Palpations: Abdomen is soft.     Tenderness: There is no abdominal tenderness.  Musculoskeletal:        General: No tenderness.     Cervical back: Normal range of  motion and neck supple.     Right lower leg: Edema (trace pitting) present.     Left lower leg: Edema (1+ pitting) present.  Skin:    General: Skin is warm and dry.     Comments: Nickel sized open wound on the left shin  Neurological:     General: No focal deficit present.     Mental Status: She is alert and oriented to person, place, and time.  Psychiatric:        Mood and Affect: Mood normal.        Behavior: Behavior normal.        Thought Content: Thought content normal.   Assessment & Plan:  1: Chronic heart failure with reduced ejection fraction- - NYHA class III - euvolemic today - weighing daily & home weight chart reviewed; home weight ranges from 141-143 pounds; reminded to call for an overnight weight gain of > 2 pounds or a weekly weight gain of > 5 pounds - weight up 2 pounds from last visit here 2 months ago - not adding salt and they are trying to read food labels closely for sodium content - trying to drink enough fluids and explained that she needed to drink 60-64 ounces  daily but to drink at least 50 ounces if she can't reach the 60 ounces - on GDMT of metoprolol - will start jardiance 10mg  daily; 2 week voucher and 2 weeks samples provided - check BMP next visit - BNP 04/06/22 was 651.9 - reports receiving her flu vaccine  2: HTN- - BP 124/66; patient says that enalapril has been stopped due to her BP getting too low - saw PCP 04/08/22) 07/20/22 - BMP 07/20/22 reviewed and showed sodium 135, potassium 4.3, creatinine 0.9 & GFR 59  3: PAF- - rate controlled on metoprolol - saw cardiology 09/18/22) 07/25/22; returns in 6 months   4: Lymphedema- - stage 2 - has been wearing compression boots for the last 15 years with good results although admits she needs to use them more consistently - elevate legs when sitting for long periods of time   Medication list reviewed.   Return in 1 month, sooner if needed.

## 2022-07-26 ENCOUNTER — Ambulatory Visit: Payer: Medicare PPO | Attending: Family | Admitting: Family

## 2022-07-26 ENCOUNTER — Encounter: Payer: Self-pay | Admitting: Family

## 2022-07-26 VITALS — BP 124/66 | HR 85 | Resp 16 | Wt 146.5 lb

## 2022-07-26 DIAGNOSIS — I890 Lymphedema, not elsewhere classified: Secondary | ICD-10-CM

## 2022-07-26 DIAGNOSIS — I480 Paroxysmal atrial fibrillation: Secondary | ICD-10-CM | POA: Diagnosis not present

## 2022-07-26 DIAGNOSIS — Z7984 Long term (current) use of oral hypoglycemic drugs: Secondary | ICD-10-CM | POA: Diagnosis not present

## 2022-07-26 DIAGNOSIS — Z79899 Other long term (current) drug therapy: Secondary | ICD-10-CM | POA: Diagnosis not present

## 2022-07-26 DIAGNOSIS — I10 Essential (primary) hypertension: Secondary | ICD-10-CM

## 2022-07-26 DIAGNOSIS — R5383 Other fatigue: Secondary | ICD-10-CM | POA: Diagnosis not present

## 2022-07-26 DIAGNOSIS — I5022 Chronic systolic (congestive) heart failure: Secondary | ICD-10-CM | POA: Diagnosis not present

## 2022-07-26 DIAGNOSIS — R609 Edema, unspecified: Secondary | ICD-10-CM | POA: Diagnosis not present

## 2022-07-26 DIAGNOSIS — I110 Hypertensive heart disease with heart failure: Secondary | ICD-10-CM | POA: Insufficient documentation

## 2022-07-26 MED ORDER — EMPAGLIFLOZIN 10 MG PO TABS: 10.0000 mg | ORAL_TABLET | Freq: Every day | ORAL | 5 refills | Status: DC

## 2022-07-26 NOTE — Patient Instructions (Addendum)
Continue weighing daily and call for an overnight weight gain of 3 pounds or more or a weekly weight gain of more than 5 pounds.   If you have voicemail, please make sure your mailbox is cleaned out so that we may leave a message and please make sure to listen to any voicemails.    Start taking jardiance as 1 tablet every morning. If you find that the cost is too much for you, please bring back income information with you to your next visit so we can apply for patient assistance.

## 2022-08-24 ENCOUNTER — Other Ambulatory Visit
Admission: RE | Admit: 2022-08-24 | Discharge: 2022-08-24 | Disposition: A | Discharge status: Home / Self Care | Payer: Medicare PPO | Source: Ambulatory Visit | Attending: Family | Admitting: Family

## 2022-08-24 ENCOUNTER — Ambulatory Visit (HOSPITAL_BASED_OUTPATIENT_CLINIC_OR_DEPARTMENT_OTHER): Payer: Medicare PPO | Admitting: Family

## 2022-08-24 ENCOUNTER — Encounter: Payer: Self-pay | Admitting: Family

## 2022-08-24 VITALS — BP 132/74 | HR 84 | Resp 16 | Wt 144.5 lb

## 2022-08-24 DIAGNOSIS — Z79899 Other long term (current) drug therapy: Secondary | ICD-10-CM | POA: Insufficient documentation

## 2022-08-24 DIAGNOSIS — I110 Hypertensive heart disease with heart failure: Secondary | ICD-10-CM | POA: Insufficient documentation

## 2022-08-24 DIAGNOSIS — M549 Dorsalgia, unspecified: Secondary | ICD-10-CM | POA: Insufficient documentation

## 2022-08-24 DIAGNOSIS — I10 Essential (primary) hypertension: Secondary | ICD-10-CM

## 2022-08-24 DIAGNOSIS — D649 Anemia, unspecified: Secondary | ICD-10-CM | POA: Insufficient documentation

## 2022-08-24 DIAGNOSIS — I480 Paroxysmal atrial fibrillation: Secondary | ICD-10-CM | POA: Insufficient documentation

## 2022-08-24 DIAGNOSIS — I890 Lymphedema, not elsewhere classified: Secondary | ICD-10-CM | POA: Insufficient documentation

## 2022-08-24 DIAGNOSIS — Z7982 Long term (current) use of aspirin: Secondary | ICD-10-CM | POA: Diagnosis not present

## 2022-08-24 DIAGNOSIS — I5022 Chronic systolic (congestive) heart failure: Secondary | ICD-10-CM | POA: Diagnosis present

## 2022-08-24 LAB — BASIC METABOLIC PANEL
Anion gap: 5 (ref 5–15)
BUN: 16 mg/dL (ref 8–23)
CO2: 28 mmol/L (ref 22–32)
Calcium: 9.2 mg/dL (ref 8.9–10.3)
Chloride: 104 mmol/L (ref 98–111)
Creatinine, Ser: 0.9 mg/dL (ref 0.44–1.00)
GFR, Estimated: 59 mL/min — ABNORMAL LOW (ref >60)
Glucose, Bld: 119 mg/dL — ABNORMAL HIGH (ref 70–99)
Potassium: 4.1 mmol/L (ref 3.5–5.1)
Sodium: 137 mmol/L (ref 135–145)

## 2022-08-24 MED ORDER — EMPAGLIFLOZIN 10 MG PO TABS: 10.0000 mg | ORAL_TABLET | Freq: Every day | ORAL | 5 refills | Status: DC

## 2022-08-24 NOTE — Progress Notes (Signed)
Patient ID: Gina White, female    DOB: 10-19-1923, 89 y.o.   MRN: [id number]  HPI  [name] is a 89 y/o female with a history of HTN, atrial fibrillation, anemia and chronic heart failure.   Echo report from 04/07/22 reviewed and showed an EF of 40-45% along with moderate LVH, severe LAE and moderate/ severe MR/TR/AR  Was in the ED 06/19/22 due to lower extremity wound and cellulitis. Admitted 04/06/22 due to chest tightness and shortness of breath. Given IV lasix with transition to oral diuretics. Cardiology consult obtained. Discharged after 2 days.   She presents today for a follow-up visit with a chief complaint of minimal fatigue with moderate exertion. Describes this as chronic in nature although "much better" since last here. Has associated pedal edema (improving) and chronic back pain along with this. Denies any difficulty sleeping, dizziness, abdominal distention, palpitations, chest pain, SOB, cough or weight gain.   Says that she feels much better since starting jardiance. Felt good enough to walk to the office instead of riding in the wheelchair.   Had previously been on enalapril but it had to be stopped because her BP had gotten too low  Past Medical History:  Diagnosis Date   Anemia    Arrhythmia    atrial fibrillation   Arthritis    CHF (congestive heart failure) ([place])    Hypertension    Past Surgical History:  Procedure Laterality Date   ABDOMINAL HYSTERECTOMY     CHOLECYSTECTOMY     JOINT REPLACEMENT     No family history on file. Social History   Tobacco Use   Smoking status: Never   Smokeless tobacco: Current    Types: Snuff  Substance Use Topics   Alcohol use: No   Allergies  Allergen Reactions   Azithromycin     Other reaction(s): Unknown   Erythromycin Ethylsuccinate     Other reaction(s): Unknown   Penicillin V Potassium     Other reaction(s): Unknown   Penicillins Other (See Comments)    Has patient had a PCN reaction causing  immediate rash, facial/tongue/throat swelling, SOB or lightheadedness with hypotension: {yes Has patient had a PCN reaction causing severe rash involving mucus membranes or skin necrosis: no Has patient had a PCN reaction that required hospitalization:no Has patient had a PCN reaction occurring within the last 10 years: :no If all of the above answers are "NO", then may proceed with Cephalosporin use.    Sulfa Antibiotics Other (See Comments)   Sulfamethoxazole-Trimethoprim Other (See Comments)   Prior to Admission medications   Medication Sig Start Date End Date Taking? Authorizing Provider  acetaminophen (TYLENOL) 500 MG tablet Take 500 mg by mouth every 6 (six) hours as needed.   Yes [provider]  aspirin EC 81 MG tablet Take 81 mg by mouth daily.   Yes [provider]  atorvastatin (LIPITOR) 20 MG tablet Take 20 mg by mouth daily at 6 PM.   Yes [provider]  bisacodyl (DULCOLAX) 5 MG EC tablet Take 5 mg by mouth daily as needed for moderate constipation.   Yes [provider]  Calcium Carbonate-Vitamin D (CALCIUM 600+D) 600-400 MG-UNIT tablet Take 2 tablets by mouth daily.   Yes [provider]  cetirizine (ZYRTEC) 10 MG tablet Take 10 mg by mouth daily.   Yes [provider]  empagliflozin (JARDIANCE) 10 MG TABS tablet Take 1 tablet (10 mg total) by mouth daily before breakfast. 07/26/22  Yes Tyla Burgner, Aura Fey,  FNP  empagliflozin (JARDIANCE) 10 MG TABS tablet Take 1 tablet (10 mg total) by mouth daily before breakfast. 08/24/22  Yes Analina Filla A, FNP  fluticasone (FLONASE) 50 MCG/ACT nasal spray Place 2 sprays into both nostrils daily.   Yes [provider]  furosemide (LASIX) 20 MG tablet Take 20 mg by mouth daily.   Yes [provider]  gabapentin (NEURONTIN) 300 MG capsule Take 300-600 mg by mouth at bedtime.    Yes [provider]  glucosamine-chondroitin 500-400 MG tablet Take 1 tablet by mouth 2  (two) times daily.   Yes [provider]  metoprolol succinate (TOPROL-XL) 50 MG 24 hr tablet Take 50 mg by mouth 2 (two) times daily. Take with or immediately following a meal.   Yes [provider]  Multiple Vitamins-Minerals (MULTIVITAMIN WITH MINERALS) tablet Take 1 tablet by mouth daily.   Yes [provider]  mupirocin ointment (BACTROBAN) 2 % Apply 1 Application topically 2 (two) times daily. 06/19/22  Yes Raspet, Erin K, PA-C  potassium chloride (MICRO-K) 10 MEQ CR capsule Take 10 mEq by mouth daily.   Yes [provider]  traMADol (ULTRAM) 50 MG tablet Take by mouth. 04/24/22  Yes [provider]  enalapril (VASOTEC) 2.5 MG tablet Take 1 tablet (2.5 mg total) by mouth daily. Patient not taking: Reported on 07/26/2022 05/25/22   Darylene Price A, FNP  EPINEPHrine 0.3 mg/0.3 mL IJ SOAJ injection Inject 0.3 mg into the muscle once. Patient not taking: Reported on 04/12/2022    [provider]  Methylcobalamin 1 MG CHEW Chew 1 mg by mouth daily. Patient not taking: Reported on 07/26/2022    [provider]  ofloxacin (OCUFLOX) 0.3 % ophthalmic solution Apply to eye. Patient not taking: Reported on 07/26/2022 05/24/22   [provider]  sennosides-docusate sodium (SENOKOT-S) 8.6-50 MG tablet Take 1 tablet by mouth daily. Patient not taking: Reported on 07/26/2022    [provider]   Review of Systems  Constitutional:  Positive for appetite change (chronically decreased) and fatigue (improving).  HENT:  Negative for congestion, postnasal drip and sore throat.   Eyes: Negative.   Respiratory:  Negative for cough, chest tightness and shortness of breath.   Cardiovascular:  Positive for leg swelling. Negative for chest pain and palpitations.  Gastrointestinal:  Negative for abdominal distention and abdominal pain.  Endocrine: Negative.   Genitourinary: Negative.   Musculoskeletal:  Positive for back pain. Negative  for neck pain.  Skin:  Positive for wound (left shin almost healed).  Allergic/Immunologic: Negative.   Neurological:  Negative for dizziness and light-headedness.  Hematological:  Negative for adenopathy. Bruises/bleeds easily.  Psychiatric/Behavioral:  Negative for dysphoric mood and sleep disturbance (sleeping on 1 pillow). The patient is not nervous/anxious.    Vitals:   08/24/22 1435  BP: 132/74  Pulse: 84  Resp: 16  SpO2: 96%  Weight: 144 lb 8 oz (65.5 kg)   Wt Readings from Last 3 Encounters:  08/24/22 144 lb 8 oz (65.5 kg)  07/26/22 146 lb 8 oz (66.5 kg)  05/25/22 144 lb (65.3 kg)   Lab Results  Component Value Date   CREATININE 0.90 06/19/2022   CREATININE 1.10 (H) 04/08/2022   CREATININE 0.74 04/07/2022   Physical Exam Vitals and nursing note reviewed. Exam conducted with a chaperone present (daughter).  Constitutional:      Appearance: Normal appearance.  HENT:     Head: Normocephalic and atraumatic.  Cardiovascular:     Rate and  Rhythm: Normal rate. Rhythm irregular.  Pulmonary:     Effort: Pulmonary effort is normal. No respiratory distress.     Breath sounds: No wheezing or rales.  Abdominal:     General: There is no distension.     Palpations: Abdomen is soft.     Tenderness: There is no abdominal tenderness.  Musculoskeletal:        General: No tenderness.     Cervical back: Normal range of motion and neck supple.     Right lower leg: Edema (trace pitting) present.     Left lower leg: Edema (1+ pitting) present.  Skin:    General: Skin is warm and dry.     Comments: Nickel sized open wound on the left shin  Neurological:     General: No focal deficit present.     Mental Status: She is alert and oriented to person, place, and time.  Psychiatric:        Mood and Affect: Mood normal.        Behavior: Behavior normal.        Thought Content: Thought content normal.   Assessment & Plan:  1: Chronic heart failure with reduced ejection fraction- -  NYHA class II - euvolemic today - weighing daily; reminded to call for an overnight weight gain of > 2 pounds or a weekly weight gain of > 5 pounds - weight down 2 pounds from last visit here 1 month ago - not adding salt and they are trying to read food labels closely for sodium content - trying to drink enough fluids and explained that she needed to drink 60-64 ounces daily but to drink at least 50 ounces if she can't reach the 60 ounces - metoprolol succinate 58m BID - jardiance 135mdaily - BMP today since jardiance started at last visit - BP has been low with past enalapril use - consider entresto/ spironolactone although need to be mindful of hypotension in the past; do not want to increase the fall risk - BNP 04/06/22 was 651.9 - reports receiving her flu vaccine  2: HTN- - BP 132/74 - saw PCP (AOuida Sills1/11/24 - BMP 07/20/22 reviewed and showed sodium 135, potassium 4.3, creatinine 0.9 & GFR 59  3: PAF- - rate controlled on metoprolol - ASA 8157maily - previous anticoag with coumadin was stopped 05/2021 due to easy bruising - saw cardiology (CaClayborn Bigness/16/24; returns in 6 months   4: Lymphedema- - stage 2 - has been wearing compression boots for the last 15 years with good results and has been wearing them more consistently - able to wear compression socks and wrap legs with velcro wraps now - elevate legs when sitting for long periods of time   Medication list reviewed.

## 2022-08-24 NOTE — Patient Instructions (Signed)
Continue weighing daily and call for an overnight weight gain of 3 pounds or more or a weekly weight gain of more than 5 pounds. ? ? ?If you have voicemail, please make sure your mailbox is cleaned out so that we may leave a message and please make sure to listen to any voicemails.  ? ? ?

## 2022-08-25 ENCOUNTER — Encounter: Payer: Self-pay | Admitting: Family

## 2022-11-21 ENCOUNTER — Encounter: Payer: Medicare PPO | Admitting: Family

## 2022-11-30 ENCOUNTER — Encounter: Payer: Self-pay | Admitting: Family

## 2022-11-30 ENCOUNTER — Ambulatory Visit: Payer: Medicare PPO | Attending: Family | Admitting: Family

## 2022-11-30 VITALS — BP 85/49 | HR 64 | Wt 143.8 lb

## 2022-11-30 DIAGNOSIS — I428 Other cardiomyopathies: Secondary | ICD-10-CM | POA: Diagnosis not present

## 2022-11-30 DIAGNOSIS — I480 Paroxysmal atrial fibrillation: Secondary | ICD-10-CM | POA: Diagnosis not present

## 2022-11-30 DIAGNOSIS — I10 Essential (primary) hypertension: Secondary | ICD-10-CM

## 2022-11-30 DIAGNOSIS — I5022 Chronic systolic (congestive) heart failure: Secondary | ICD-10-CM | POA: Diagnosis not present

## 2022-11-30 DIAGNOSIS — I509 Heart failure, unspecified: Secondary | ICD-10-CM | POA: Insufficient documentation

## 2022-11-30 DIAGNOSIS — I890 Lymphedema, not elsewhere classified: Secondary | ICD-10-CM | POA: Insufficient documentation

## 2022-11-30 DIAGNOSIS — Z79899 Other long term (current) drug therapy: Secondary | ICD-10-CM | POA: Insufficient documentation

## 2022-11-30 DIAGNOSIS — Z7982 Long term (current) use of aspirin: Secondary | ICD-10-CM | POA: Insufficient documentation

## 2022-11-30 DIAGNOSIS — I110 Hypertensive heart disease with heart failure: Secondary | ICD-10-CM | POA: Diagnosis not present

## 2022-11-30 NOTE — Progress Notes (Addendum)
[facility] HEART FAILURE CLINIC - Pharmacist Note  Gina White is a 89 y.o. female with HFmrEF (EF 41-49%) presenting to the Heart Failure Clinic for follow up. Patient reports no medication access issues and no adverse effects on her current regimen. 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 Today, she is a bit hypotensive. She reports no symptoms of hypotension. She also reports no signs or symptoms of volume overload. Her daughter helps with managing her fluid intake. She weighs herself daily and reports that her weight has been stable around 140 lbs.   Recent ED Visit (past 6 months):  Date: 06/19/2022, CC: wound check   Guideline-Directed Medical Therapy/Evidence Based Medicine ACE/ARB/ARNI: none Beta Blocker: Metoprolol succinate 50 mg twice daily Aldosterone Antagonist: none Diuretic: Furosemide 20 mg daily SGLT2i: Empagliflozin 10 mg daily  Adherence Assessment Do you ever forget to take your medication? [] Yes [x] No  Do you ever skip doses due to side effects? [] Yes [x] No  Do you have trouble affording your medicines? [] Yes [x] No  Are you ever unable to pick up your medication due to transportation difficulties? [] Yes [x] No  Do you ever stop taking your medications because you don't believe they are helping? [] Yes [x] No  Do you check your weight daily? [x] Yes [] No  Adherence strategy: pillbox Barriers to obtaining medications: none reported   Diagnostics ECHO: Date 04/07/2022, EF 40-45%, GHK, moderate concentric LVH  Vitals    11/30/2022    9:58 AM 08/24/2022    2:35 PM 07/26/2022   12:54 PM  Vitals with BMI  Weight 143 lbs 13 oz 144 lbs 8 oz 146 lbs 8 oz  Systolic 85 132 124  Diastolic 49 74 66  Pulse 64 84 85     Recent Labs    Latest Ref Rng & Units 08/24/2022    3:11 PM 06/19/2022   11:46 AM 04/08/2022    4:40 AM  BMP  Glucose 70 - 99 mg/dL 161  096  045   BUN 8 - 23 mg/dL 16  21  24     Creatinine 0.44 - 1.00 mg/dL 4.09  8.11  9.14   Sodium 135 - 145 mmol/L 137  135  131   Potassium 3.5 - 5.1 mmol/L 4.1  4.3  3.5   Chloride 98 - 111 mmol/L 104  103  92   CO2 22 - 32 mmol/L 28  25  28    Calcium 8.9 - 10.3 mg/dL 9.2  8.6  9.3     Past Medical History Past Medical History:  Diagnosis Date   Anemia    Arrhythmia    atrial fibrillation   Arthritis    CHF (congestive heart failure) ([facility])    Hypertension     Plan Continue regimen as directed by NP Reduce metoprolol succinate to 25 mg BID Monitor BP at home and alert provider if <120/60 Consider cautious addition of GDMT given patient's age, history of hypotension, and risk for falls Annual echo due 03/2023  Time spent: 10 minutes  Celene Squibb, PharmD PGY1 Pharmacy Resident 11/30/2022 11:06 AM

## 2022-11-30 NOTE — Patient Instructions (Signed)
Decrease your metoprolol to 1/2 tablet in the morning and 1/2 tablet in the evening.    Check your blood pressure daily and bring that log to your appointments. If your blood pressure stays < 100/60, let me know.

## 2022-11-30 NOTE — Progress Notes (Signed)
PCP: Einar Crow, MD (last seen 01/24) Primary Cardiologist: Dorothyann Peng, MD (last seen 01/24)  HPI:  Ms Eatherly is a 89 y/o female with a history of HTN, atrial fibrillation, anemia and chronic heart failure.   Echo report from 04/07/22 reviewed and showed an EF of 40-45% along with moderate LVH, severe LAE and moderate/ severe MR/TR/AR  Was in the ED 06/19/22 due to lower extremity wound and cellulitis. Admitted 04/06/22 due to chest tightness and shortness of breath. Given IV lasix with transition to oral diuretics. Cardiology consult obtained. Discharged after 2 days.   She presents today for a follow-up visit with a chief complaint of minimal SOB with moderate exertion. Chronic in nature. Has associated fatigue, dizziness and pedal edema along with this.   Aide that is present w/ her says that her blood pressure gets checked almost daily and it's never been this low. Had previously been on enalapril but it had to be stopped because her BP had gotten too low  ROS: All systems negative except as listed in HPI, PMH and Problem List.  SH:  Social History   Socioeconomic History   Marital status: Widowed    Spouse name: Not on file   Number of children: Not on file   Years of education: Not on file   Highest education level: Not on file  Occupational History   Not on file  Tobacco Use   Smoking status: Never   Smokeless tobacco: Current    Types: Snuff  Vaping Use   Vaping Use: Never used  Substance and Sexual Activity   Alcohol use: No   Drug use: Never   Sexual activity: Not on file  Other Topics Concern   Not on file  Social History Narrative   Not on file   Social Determinants of Health   Financial Resource Strain: Not on file  Food Insecurity: No Food Insecurity (04/07/2022)   Hunger Vital Sign    Worried About Running Out of Food in the Last Year: Never true    Ran Out of Food in the Last Year: Never true  Transportation Needs: No Transportation  Needs (04/07/2022)   PRAPARE - Administrator, Civil Service (Medical): No    Lack of Transportation (Non-Medical): No  Physical Activity: Not on file  Stress: Not on file  Social Connections: Not on file  Intimate Partner Violence: Not At Risk (04/07/2022)   Humiliation, Afraid, Rape, and Kick questionnaire    Fear of Current or Ex-Partner: No    Emotionally Abused: No    Physically Abused: No    Sexually Abused: No    FH: No family history on file.  Past Medical History:  Diagnosis Date   Anemia    Arrhythmia    atrial fibrillation   Arthritis    CHF (congestive heart failure) ([facility])    Hypertension     Current Outpatient Medications  Medication Sig Dispense Refill   acetaminophen (TYLENOL) 500 MG tablet Take 500 mg by mouth every 6 (six) hours as needed.     aspirin EC 81 MG tablet Take 81 mg by mouth daily.     atorvastatin (LIPITOR) 20 MG tablet Take 20 mg by mouth daily at 6 PM.     bisacodyl (DULCOLAX) 5 MG EC tablet Take 5 mg by mouth daily as needed for moderate constipation.     Calcium Carbonate-Vitamin D (CALCIUM 600+D) 600-400 MG-UNIT tablet Take 1 tablet by mouth daily.     cetirizine (ZYRTEC)  10 MG tablet Take 10 mg by mouth daily.     empagliflozin (JARDIANCE) 10 MG TABS tablet Take 1 tablet (10 mg total) by mouth daily before breakfast. 30 tablet 5   fluticasone (FLONASE) 50 MCG/ACT nasal spray Place 2 sprays into both nostrils daily.     furosemide (LASIX) 20 MG tablet Take 20 mg by mouth daily.     gabapentin (NEURONTIN) 300 MG capsule Take 300-600 mg by mouth at bedtime.      glucosamine-chondroitin 500-400 MG tablet Take 1 tablet by mouth 2 (two) times daily.     Methylcobalamin 1 MG CHEW Chew 1 mg by mouth daily. (Patient not taking: Reported on 07/26/2022)     metoprolol succinate (TOPROL-XL) 50 MG 24 hr tablet Take 50 mg by mouth 2 (two) times daily. Take with or immediately following a meal.     Multiple Vitamins-Minerals (MULTIVITAMIN WITH  MINERALS) tablet Take 1 tablet by mouth daily.     mupirocin ointment (BACTROBAN) 2 % Apply 1 Application topically 2 (two) times daily. 22 g 0   ofloxacin (OCUFLOX) 0.3 % ophthalmic solution Apply to eye. (Patient not taking: Reported on 07/26/2022)     potassium chloride (MICRO-K) 10 MEQ CR capsule Take 10 mEq by mouth daily.     sennosides-docusate sodium (SENOKOT-S) 8.6-50 MG tablet Take 1 tablet by mouth daily. (Patient not taking: Reported on 07/26/2022)     traMADol (ULTRAM) 50 MG tablet Take by mouth.     No current facility-administered medications for this visit.    Vitals:   11/30/22 0958  BP: (!) 85/49  Pulse: 64  SpO2: 97%  Weight: 143 lb 12.8 oz (65.2 kg)    PHYSICAL EXAM:  General:  Well appearing. No resp difficulty HEENT: normal Neck: supple. JVP flat. No lymphadenopathy or thryomegaly appreciated. Cor: PMI normal. Regular rate & rhythm. No rubs, gallops or murmurs. Lungs: clear Abdomen: soft, nontender, nondistended. No hepatosplenomegaly. No bruits or masses.  Extremities: no cyanosis, clubbing, rash, edema Neuro: alert & oriented x3, cranial nerves grossly intact. Moves all 4 extremities w/o difficulty. Affect pleasant.   ECG: not done   ASSESSMENT & PLAN:  1: NICM with reduced ejection fraction- - likely HTN/ PAF - NYHA class II - euvolemic today - weighing daily; reminded to call for an overnight weight gain of > 2 pounds or a weekly weight gain of > 5 pounds - weight stable from last visit here 1 month ago - not adding salt and they are trying to read food labels closely for sodium content - trying to drink enough fluids and explained that she needed to drink 60-64 ounces daily but to drink at least 50 ounces if she can't reach the 60 ounces - decrease metoprolol succinate to 25mg  BID - continue jardiance 10mg  daily - BP has been low with past enalapril use; unable to titrate/ add other GDMT due to BP - BNP 04/06/22 was 651.9  2: HTN- - BP 85/49;  decreasing metoprolol per above - to check BP daily and let us know if it gets <100/60 - saw PCP Dareen Piano) 01/24; returns 06/24 - BMP 08/24/22 reviewed and showed sodium 137, potassium 4.1, creatinine 0.9 & GFR 59  3: PAF- - rate controlled on metoprolol - ASA 81mg  daily - previous anticoag with coumadin was stopped 05/2021 due to easy bruising - saw cardiology Juliann Pares) 01/24; returns 07/24  4: Lymphedema- - stage 2 - has been wearing compression boots for the last 15 years with good results and  has been wearing them more consistently - able to wear compression socks and wrap legs with velcro wraps now - elevate legs when sitting for long periods of time   Medication list reviewed.   Return in 3 months, sooner if needed.

## 2022-12-20 ENCOUNTER — Ambulatory Visit
Admission: EM | Admit: 2022-12-20 | Discharge: 2022-12-20 | Disposition: A | Discharge status: Home / Self Care | Payer: Medicare PPO | Attending: Family Medicine | Admitting: Family Medicine

## 2022-12-20 ENCOUNTER — Other Ambulatory Visit: Payer: Self-pay

## 2022-12-20 DIAGNOSIS — Z23 Encounter for immunization: Secondary | ICD-10-CM | POA: Diagnosis not present

## 2022-12-20 DIAGNOSIS — W19XXXA Unspecified fall, initial encounter: Secondary | ICD-10-CM

## 2022-12-20 DIAGNOSIS — S51819A Laceration without foreign body of unspecified forearm, initial encounter: Secondary | ICD-10-CM

## 2022-12-20 MED ORDER — TETANUS-DIPHTH-ACELL PERTUSSIS 5-2.5-18.5 LF-MCG/0.5 IM SUSY
0.5000 mL | PREFILLED_SYRINGE | Freq: Once | INTRAMUSCULAR | Status: AC
  Administered 2022-12-20: 0.5 mL via INTRAMUSCULAR

## 2022-12-20 MED ORDER — DOXYCYCLINE HYCLATE 100 MG PO CAPS: 100.0000 mg | ORAL_CAPSULE | Freq: Two times a day (BID) | ORAL | 0 refills | Status: DC

## 2022-12-20 NOTE — ED Triage Notes (Signed)
Pt is here with a right forearm abrasion after falling trying to reach for her walker to get out of bed at 4am this am. Pt has not taken any meds to relieve discomfort.

## 2022-12-20 NOTE — Discharge Instructions (Signed)
Stop by the pharmacy to pick up your prescriptions.  Your skin glue will gradually lift over the next 1-2 weeks. Trim the edges as it comes off revealing your new skin.  Watch for signs and symptoms of infection like fever worsening redness or increasing pain. See handout for more information.

## 2022-12-20 NOTE — ED Provider Notes (Signed)
[facility]    CSN: [id number] Arrival date & time: 12/20/22  1448      History   Chief Complaint Chief Complaint  Patient presents with   Arm Injury    HPI Gina White is a 89 y.o. female.   HPI  Gina White presents for right forearm injury that occurred after getting out of the bed this morning around 4 AM.  States that she was reaching for her walker and fell injuring herself.  She has a skin injury on her right forearm.  She does  take aspirin daily.  She did not hit her head during the fall.  She was able to get up without difficulty.  No medications prior to arrival.  Has some chronic back pain but this is not new.  Denies any other injuries.   Past Medical History:  Diagnosis Date   Anemia    Arrhythmia    atrial fibrillation   Arthritis    CHF (congestive heart failure) (HCC)    Hypertension     Patient Active Problem List   Diagnosis Date Noted   Acute systolic CHF (congestive heart failure) (HCC) 04/06/2022   Chronic anemia 04/06/2022   Atypical chest pain 04/06/2022   Pleural effusion on left 04/06/2022   Lymphedema 01/17/2017   Chronic venous insufficiency 01/17/2017   Essential hypertension 01/17/2017   Hyperlipidemia 01/17/2017   GERD (gastroesophageal reflux disease) 01/17/2017   Paroxysmal atrial fibrillation (HCC) 10/19/2013    Past Surgical History:  Procedure Laterality Date   ABDOMINAL HYSTERECTOMY     CHOLECYSTECTOMY     JOINT REPLACEMENT      OB History   No obstetric history on file.      Home Medications    Prior to Admission medications   Medication Sig Start Date End Date Taking? Authorizing Provider  doxycycline (VIBRAMYCIN) 100 MG capsule Take 1 capsule (100 mg total) by mouth 2 (two) times daily. 12/20/22  Yes Jolana Runkles, Seward Meth, DO  acetaminophen (TYLENOL) 500 MG tablet Take 500 mg by mouth every 6 (six) hours as needed.    [provider]  aspirin EC 81 MG tablet Take 81 mg by mouth daily.     [provider]  atorvastatin (LIPITOR) 20 MG tablet Take 20 mg by mouth daily at 6 PM.    [provider]  bisacodyl (DULCOLAX) 5 MG EC tablet Take 5 mg by mouth daily as needed for moderate constipation.    [provider]  Calcium Carbonate-Vitamin D (CALCIUM 600+D) 600-400 MG-UNIT tablet Take 1 tablet by mouth daily.    [provider]  cetirizine (ZYRTEC) 10 MG tablet Take 10 mg by mouth daily.    [provider]  empagliflozin (JARDIANCE) 10 MG TABS tablet Take 1 tablet (10 mg total) by mouth daily before breakfast. 08/24/22   Delma Freeze, FNP  fluticasone (FLONASE) 50 MCG/ACT nasal spray Place 2 sprays into both nostrils daily.    [provider]  furosemide (LASIX) 20 MG tablet Take 20 mg by mouth daily.    [provider]  gabapentin (NEURONTIN) 300 MG capsule Take 300 mg by mouth 2 (two) times daily.    [provider]  glucosamine-chondroitin 500-400 MG tablet Take 1 tablet by mouth daily.    [provider]  metoprolol succinate (TOPROL-XL) 50 MG 24 hr tablet Take 50 mg by mouth 2 (two) times daily. Take with or immediately following a meal.    [provider]  Multiple Vitamins-Minerals (MULTIVITAMIN  WITH MINERALS) tablet Take 1 tablet by mouth daily.    [provider]  mupirocin ointment (BACTROBAN) 2 % Apply 1 Application topically 2 (two) times daily. 06/19/22   Raspet, Noberto Retort, PA-C  ofloxacin (OCUFLOX) 0.3 % ophthalmic solution Apply to eye. 05/24/22   [provider]  potassium chloride (MICRO-K) 10 MEQ CR capsule Take 10 mEq by mouth daily.    [provider]  tizanidine (ZANAFLEX) 2 MG capsule Take 2 mg by mouth 3 (three) times daily.    [provider]  traMADol (ULTRAM) 50 MG tablet Take 50 mg by mouth every 6 (six) hours as needed for moderate pain. 04/24/22   [provider]    Family History History reviewed. No pertinent family  history.  Social History Social History   Tobacco Use   Smoking status: Never   Smokeless tobacco: Current    Types: Snuff  Vaping Use   Vaping Use: Never used  Substance Use Topics   Alcohol use: No   Drug use: Never     Allergies   Azithromycin, Erythromycin ethylsuccinate, Penicillin v potassium, Penicillins, Sulfa antibiotics, and Sulfamethoxazole-trimethoprim   Review of Systems Review of Systems :negative unless otherwise stated in HPI.      Physical Exam Triage Vital Signs ED Triage Vitals  Enc Vitals Group     BP 12/20/22 1539 106/60     Pulse Rate 12/20/22 1539 90     Resp 12/20/22 1539 16     Temp 12/20/22 1539 98.7 F (37.1 C)     Temp Source 12/20/22 1539 Oral     SpO2 12/20/22 1539 99 %     Weight --      Height --      Head Circumference --      Peak Flow --      Pain Score 12/20/22 1536 3     Pain Loc --      Pain Edu? --      Excl. in GC? --    No data found.  Updated Vital Signs BP 106/60 (BP Location: Right Arm)   Pulse 90   Temp 98.7 F (37.1 C) (Oral)   Resp 16   SpO2 99%   Visual Acuity Right Eye Distance:   Left Eye Distance:   Bilateral Distance:    Right Eye Near:   Left Eye Near:    Bilateral Near:     Physical Exam  GEN: alert, chronically ill appearing elderly female, in no acute distress  EYES: extra occular movements intact, no scleral injection CV: regular rate, strong radial pulse  RESP: no increased work of breathing MSK: normal ROM of elbow and good ROM of wrist on right, skin tear on anterior forearm as below  SKIN: warm and dry; U-shaped anterior skin tear with partial flap loss of the right anterior forearm, 8 cm in length (ISTAP grade 2)    UC Treatments / Results  Labs (all labs ordered are listed, but only abnormal results are displayed) Labs Reviewed - No data to display  EKG   Radiology No results found.  Procedures Laceration Repair  Date/Time: 12/20/2022 5:48 PM  Performed by: Katha Cabal, DO Authorized by: Katha Cabal, DO   Consent:    Consent obtained:  Verbal   Consent given by:  Patient   Risks, benefits, and alternatives were discussed: yes     Risks discussed:  Infection, poor wound healing and poor cosmetic result   Alternatives discussed:  Observation, no  treatment and referral Universal protocol:    Patient identity confirmed:  Verbally with patient Anesthesia:    Anesthesia method:  None Laceration details:    Location:  Shoulder/arm   Shoulder/arm location:  R lower arm   Length (cm):  8 Exploration:    Hemostasis achieved with:  Direct pressure Treatment:    Area cleansed with:  Chlorhexidine   Amount of cleaning:  Standard Skin repair:    Repair method:  Tissue adhesive Repair type:    Repair type:  Simple Post-procedure details:    Dressing:  Non-adherent dressing   Procedure completion:  Tolerated  (including critical care time)  Medications Ordered in UC Medications  Tdap (BOOSTRIX) injection 0.5 mL (0.5 mLs Intramuscular Given 12/20/22 1555)    Initial Impression / Assessment and Plan / UC Course  I have reviewed the triage vital signs and the nursing notes.  Pertinent labs & imaging results that were available during my care of the patient were reviewed by me and considered in my medical decision making (see chart for details).     Patient is a 89 y.o. femalewho presents for .  Overall, patient is well-appearing and well-hydrated.  Vital signs stable.  Shaylynn is afebrile.  Exam concerning for Grade 2 ISTAP skin tear.  Defect closed with Dermabond. Given doxycyline for prophylaxis.  Tetanus given prior to discharge.  Reviewed expectations regarding course of current medical issues.  All questions asked were answered.  Outlined signs and symptoms indicating need for more acute intervention. Patient verbalized understanding. After Visit Summary given.   Final Clinical Impressions(s) / UC Diagnoses   Final diagnoses:  Fall,  initial encounter  ISTAP type 2 skin tear of forearm     Discharge Instructions      Stop by the pharmacy to pick up your prescriptions.  Your skin glue will gradually lift over the next 1-2 weeks. Trim the edges as it comes off revealing your new skin.  Watch for signs and symptoms of infection like fever worsening redness or increasing pain. See handout for more information.     ED Prescriptions     Medication Sig Dispense Auth. Provider   doxycycline (VIBRAMYCIN) 100 MG capsule Take 1 capsule (100 mg total) by mouth 2 (two) times daily. 20 capsule Katha Cabal, DO      PDMP not reviewed this encounter.     Katha Cabal, DO 12/20/22 1750

## 2023-02-11 ENCOUNTER — Other Ambulatory Visit: Payer: Self-pay | Admitting: Family

## 2023-03-05 ENCOUNTER — Encounter: Payer: Self-pay | Admitting: Family

## 2023-03-05 ENCOUNTER — Ambulatory Visit: Payer: Medicare PPO | Attending: Family | Admitting: Family

## 2023-03-05 VITALS — BP 112/65 | HR 84 | Resp 14 | Wt 140.2 lb

## 2023-03-05 DIAGNOSIS — I10 Essential (primary) hypertension: Secondary | ICD-10-CM

## 2023-03-05 DIAGNOSIS — I480 Paroxysmal atrial fibrillation: Secondary | ICD-10-CM | POA: Insufficient documentation

## 2023-03-05 DIAGNOSIS — R0789 Other chest pain: Secondary | ICD-10-CM | POA: Diagnosis not present

## 2023-03-05 DIAGNOSIS — E785 Hyperlipidemia, unspecified: Secondary | ICD-10-CM | POA: Insufficient documentation

## 2023-03-05 DIAGNOSIS — I428 Other cardiomyopathies: Secondary | ICD-10-CM | POA: Insufficient documentation

## 2023-03-05 DIAGNOSIS — Z79899 Other long term (current) drug therapy: Secondary | ICD-10-CM | POA: Insufficient documentation

## 2023-03-05 DIAGNOSIS — I890 Lymphedema, not elsewhere classified: Secondary | ICD-10-CM | POA: Insufficient documentation

## 2023-03-05 DIAGNOSIS — I5022 Chronic systolic (congestive) heart failure: Secondary | ICD-10-CM | POA: Diagnosis not present

## 2023-03-05 DIAGNOSIS — I110 Hypertensive heart disease with heart failure: Secondary | ICD-10-CM | POA: Diagnosis not present

## 2023-03-05 DIAGNOSIS — K219 Gastro-esophageal reflux disease without esophagitis: Secondary | ICD-10-CM | POA: Insufficient documentation

## 2023-03-05 DIAGNOSIS — Z7982 Long term (current) use of aspirin: Secondary | ICD-10-CM | POA: Insufficient documentation

## 2023-03-05 DIAGNOSIS — I509 Heart failure, unspecified: Secondary | ICD-10-CM | POA: Insufficient documentation

## 2023-03-05 DIAGNOSIS — Z8673 Personal history of transient ischemic attack (TIA), and cerebral infarction without residual deficits: Secondary | ICD-10-CM | POA: Insufficient documentation

## 2023-03-05 NOTE — Patient Instructions (Signed)
Call us in the future if you need [place] for anything. Keep up the good work!

## 2023-03-05 NOTE — Progress Notes (Signed)
PCP: Einar Crow, MD (last seen 06/24) Primary Cardiologist: Dorothyann Peng, MD (last seen 07/24; returns 01/25)  HPI:  Gina White is a 89 y/o female with a history of HTN, atrial fibrillation, CVA, GERD, hyperlipidemia, lymphedema, anemia and chronic heart failure.   Admitted 04/06/22 due to chest tightness and shortness of breath. Given IV lasix with transition to oral diuretics. Cardiology consult obtained. Discharged after 2 days. Was in the ED 06/19/22 due to lower extremity wound and cellulitis. Was in the ED 12/20/22 due to right forearm injury that occurred after getting out of the bed this morning around 4 AM. States that she was reaching for her walker and fell injuring herself. She has a skin injury on her right forearm. Laceration repaired.   Echo 04/07/22: EF of 40-45% along with moderate LVH, severe LAE and moderate/ severe MR/TR/AR  She presents today for a follow-up visit with a chief complaint of minimal SOB with moderate exertion.Chronic in nature. Has associated fatigue and chronic lymphedema along with this. Denies chest pain, cough, palpitations, abdominal distention, dizziness, weight gain or difficulty sleeping. Does have a bruise on her right forehead where she hit her head on the cabinet door. Also has a bruise on the right side of her face and thinks she may have laid on her hand wrong all night.   ROS: All systems negative except as listed in HPI, PMH and Problem List.  SH:  Social History   Socioeconomic History   Marital status: Widowed    Spouse name: Not on file   Number of children: Not on file   Years of education: Not on file   Highest education level: Not on file  Occupational History   Not on file  Tobacco Use   Smoking status: Never   Smokeless tobacco: Current    Types: Snuff  Vaping Use   Vaping status: Never Used  Substance and Sexual Activity   Alcohol use: No   Drug use: Never   Sexual activity: Not on file  Other Topics Concern    Not on file  Social History Narrative   Not on file   Social Determinants of Health   Financial Resource Strain: Not on file  Food Insecurity: No Food Insecurity (04/07/2022)   Hunger Vital Sign    Worried About Running Out of Food in the Last Year: Never true    Ran Out of Food in the Last Year: Never true  Transportation Needs: No Transportation Needs (04/07/2022)   PRAPARE - Administrator, Civil Service (Medical): No    Lack of Transportation (Non-Medical): No  Physical Activity: Not on file  Stress: Not on file  Social Connections: Not on file  Intimate Partner Violence: Not At Risk (04/07/2022)   Humiliation, Afraid, Rape, and Kick questionnaire    Fear of Current or Ex-Partner: No    Emotionally Abused: No    Physically Abused: No    Sexually Abused: No    FH: No family history on file.  Past Medical History:  Diagnosis Date   Anemia    Arrhythmia    atrial fibrillation   Arthritis    CHF (congestive heart failure) (HCC)    Hypertension     Current Outpatient Medications  Medication Sig Dispense Refill   acetaminophen (TYLENOL) 500 MG tablet Take 500 mg by mouth every 6 (six) hours as needed.     aspirin EC 81 MG tablet Take 81 mg by mouth daily.     atorvastatin (LIPITOR)  20 MG tablet Take 20 mg by mouth daily at 6 PM.     bisacodyl (DULCOLAX) 5 MG EC tablet Take 5 mg by mouth daily as needed for moderate constipation.     Calcium Carbonate-Vitamin D (CALCIUM 600+D) 600-400 MG-UNIT tablet Take 1 tablet by mouth daily.     cetirizine (ZYRTEC) 10 MG tablet Take 10 mg by mouth daily.     doxycycline (VIBRAMYCIN) 100 MG capsule Take 1 capsule (100 mg total) by mouth 2 (two) times daily. 20 capsule 0   fluticasone (FLONASE) 50 MCG/ACT nasal spray Place 2 sprays into both nostrils daily.     furosemide (LASIX) 20 MG tablet Take 20 mg by mouth daily.     gabapentin (NEURONTIN) 300 MG capsule Take 300 mg by mouth 2 (two) times daily.      glucosamine-chondroitin 500-400 MG tablet Take 1 tablet by mouth daily.     JARDIANCE 10 MG TABS tablet Take 1 tablet (10 mg total) by mouth daily before breakfast. 30 tablet 0   metoprolol succinate (TOPROL-XL) 50 MG 24 hr tablet Take 50 mg by mouth 2 (two) times daily. Take with or immediately following a meal.     Multiple Vitamins-Minerals (MULTIVITAMIN WITH MINERALS) tablet Take 1 tablet by mouth daily.     mupirocin ointment (BACTROBAN) 2 % Apply 1 Application topically 2 (two) times daily. 22 g 0   ofloxacin (OCUFLOX) 0.3 % ophthalmic solution Apply to eye.     potassium chloride (MICRO-K) 10 MEQ CR capsule Take 10 mEq by mouth daily.     tizanidine (ZANAFLEX) 2 MG capsule Take 2 mg by mouth 3 (three) times daily.     traMADol (ULTRAM) 50 MG tablet Take 50 mg by mouth every 6 (six) hours as needed for moderate pain.     No current facility-administered medications for this visit.   Vitals:   03/05/23 1347  BP: 112/65  Pulse: 84  Resp: 14  SpO2: 100%  Weight: 140 lb 4 oz (63.6 kg)   Wt Readings from Last 3 Encounters:  03/05/23 140 lb 4 oz (63.6 kg)  11/30/22 143 lb 12.8 oz (65.2 kg)  08/24/22 144 lb 8 oz (65.5 kg)   Lab Results  Component Value Date   CREATININE 0.90 08/24/2022   CREATININE 0.90 06/19/2022   CREATININE 1.10 (H) 04/08/2022   PHYSICAL EXAM:  General:  Well appearing. No resp difficulty HEENT: normal Neck: supple. JVP flat. No lymphadenopathy or thryomegaly appreciated. Cor: PMI normal. Regular rate & rhythm. No rubs, gallops or murmurs. Lungs: clear Abdomen: soft, nontender, nondistended. No hepatosplenomegaly. No bruits or masses.  Extremities: no cyanosis, clubbing, rash, edema; legs wrapped with velcro wraps bilaterally Neuro: alert & orientedx3, cranial nerves grossly intact. Moves all 4 extremities w/o difficulty. Affect pleasant.   ECG: not done   ASSESSMENT & PLAN:  1: NICM with reduced ejection fraction- - likely HTN/ PAF - NYHA class  II - euvolemic today - weighing daily; reminded to call for an overnight weight gain of > 2 pounds or a weekly weight gain of > 5 pounds - weight down 3 pounds from last visit here 3 months ago - not adding salt and they are trying to read food labels closely for sodium content - trying to drink enough fluids and explained that she needed to drink 60-64 ounces daily but to drink at least 50 ounces if she can't reach the 60 ounces - continue metoprolol succinate 25mg  BID - continue jardiance 10mg  daily -  continue furosemide 20mg  daily/ potassium daily - BP has been low with past enalapril use; unable to titrate/ add other GDMT due to BP - BNP 04/06/22 was 651.9 - PharmD reconciled meds w/ patient  2: HTN- - BP 112/65 - saw PCP Dareen Piano) 06/24 - BMP 08/24/22 reviewed and showed sodium 137, potassium 4.1, creatinine 0.9 & GFR 59; has labs scheduled for PCP appt in 3 months  3: PAF- - rate controlled on metoprolol - ASA 81mg  daily - previous anticoag with coumadin was stopped 05/2021 due to easy bruising/ increased fall risk - saw cardiology Juliann Pares) 07/24  4: Lymphedema- - stage 2 - has been wearing compression boots for the last 15 years with good results; tries to wear them twice a day - able to wear compression socks and wrap legs with velcro wraps now - elevate legs when sitting for long periods of time  Due to HF stability, will not make a return appointment at this time. Advised patient to follow closely with cardiology and PCP but that she could call for an appointment or for any questions and she was comfortable with this plan.

## 2023-03-12 ENCOUNTER — Other Ambulatory Visit: Payer: Self-pay | Admitting: Family

## 2023-03-27 ENCOUNTER — Telehealth: Payer: Self-pay

## 2023-03-27 MED ORDER — METOPROLOL SUCCINATE ER 25 MG PO TB24: 12.5000 mg | ORAL_TABLET | Freq: Two times a day (BID) | ORAL | 3 refills | Status: AC

## 2023-03-27 NOTE — Telephone Encounter (Signed)
    Gina White (pt daughter ) call regarding pt low BP. Daughter reports lowest bp 91/45 and current bp 100/63. Pt has been taking Metoprolol 25 mg two times a day.  Daughter stated that pt began feeling dizzy and light headedness on Saturday and just wants to be in bed.  She stated that pt continue to feel the same way today. She wanted to know if she should cut her metoprolol in half.   Per Clarisa Kindred, FNP recommendations: To take Metoprolol 12.5 mg (0.5 tablet) two times a day. Keep a BP and HR log. If HR increase /changes (afib) to call Dr. Juliann Pares to make him aware. If pt becomes SOB and/CP to call EMS.  Pt's daughter aware, agreeable, and verbalized understanding

## 2023-09-07 ENCOUNTER — Other Ambulatory Visit: Payer: Self-pay | Admitting: Family

## 2023-12-16 ENCOUNTER — Other Ambulatory Visit: Payer: Self-pay

## 2023-12-16 ENCOUNTER — Emergency Department
Admission: EM | Admit: 2023-12-16 | Discharge: 2023-12-16 | Disposition: A | Discharge status: Home / Self Care | Attending: Emergency Medicine | Admitting: Emergency Medicine

## 2023-12-16 ENCOUNTER — Emergency Department

## 2023-12-16 DIAGNOSIS — I110 Hypertensive heart disease with heart failure: Secondary | ICD-10-CM | POA: Diagnosis not present

## 2023-12-16 DIAGNOSIS — R42 Dizziness and giddiness: Secondary | ICD-10-CM | POA: Insufficient documentation

## 2023-12-16 DIAGNOSIS — I509 Heart failure, unspecified: Secondary | ICD-10-CM | POA: Insufficient documentation

## 2023-12-16 DIAGNOSIS — Z7901 Long term (current) use of anticoagulants: Secondary | ICD-10-CM | POA: Insufficient documentation

## 2023-12-16 LAB — URINALYSIS, ROUTINE W REFLEX MICROSCOPIC
Bacteria, UA: NONE SEEN
Bilirubin Urine: NEGATIVE
Glucose, UA: >=500 mg/dL — AB
Hgb urine dipstick: NEGATIVE
Ketones, ur: NEGATIVE mg/dL
Leukocytes,Ua: NEGATIVE
Nitrite: NEGATIVE
Protein, ur: NEGATIVE mg/dL
Specific Gravity, Urine: 1.01 (ref 1.005–1.030)
pH: 7 (ref 5.0–8.0)

## 2023-12-16 LAB — CBC WITH DIFFERENTIAL/PLATELET
Abs Immature Granulocytes: 0.03 10*3/uL (ref 0.00–0.07)
Basophils Absolute: 0 10*3/uL (ref 0.0–0.1)
Basophils Relative: 1 %
Eosinophils Absolute: 0.1 10*3/uL (ref 0.0–0.5)
Eosinophils Relative: 1 %
HCT: 37.6 % (ref 36.0–46.0)
Hemoglobin: 12.4 g/dL (ref 12.0–15.0)
Immature Granulocytes: 0 %
Lymphocytes Relative: 23 %
Lymphs Abs: 1.9 10*3/uL (ref 0.7–4.0)
MCH: 32.2 pg (ref 26.0–34.0)
MCHC: 33 g/dL (ref 30.0–36.0)
MCV: 97.7 fL (ref 80.0–100.0)
Monocytes Absolute: 0.7 10*3/uL (ref 0.1–1.0)
Monocytes Relative: 9 %
Neutro Abs: 5.3 10*3/uL (ref 1.7–7.7)
Neutrophils Relative %: 66 %
Platelets: 203 10*3/uL (ref 150–400)
RBC: 3.85 MIL/uL — ABNORMAL LOW (ref 3.87–5.11)
RDW: 13.3 % (ref 11.5–15.5)
WBC: 8.1 10*3/uL (ref 4.0–10.5)
nRBC: 0 % (ref 0.0–0.2)

## 2023-12-16 LAB — COMPREHENSIVE METABOLIC PANEL WITH GFR
ALT: 14 U/L (ref 0–44)
AST: 27 U/L (ref 15–41)
Albumin: 3.8 g/dL (ref 3.5–5.0)
Alkaline Phosphatase: 47 U/L (ref 38–126)
Anion gap: 10 (ref 5–15)
BUN: 22 mg/dL (ref 8–23)
CO2: 23 mmol/L (ref 22–32)
Calcium: 9.6 mg/dL (ref 8.9–10.3)
Chloride: 103 mmol/L (ref 98–111)
Creatinine, Ser: 0.88 mg/dL (ref 0.44–1.00)
GFR, Estimated: 59 mL/min — ABNORMAL LOW (ref >60)
Glucose, Bld: 106 mg/dL — ABNORMAL HIGH (ref 70–99)
Potassium: 4 mmol/L (ref 3.5–5.1)
Sodium: 136 mmol/L (ref 135–145)
Total Bilirubin: 1 mg/dL (ref 0.0–1.2)
Total Protein: 6.6 g/dL (ref 6.5–8.1)

## 2023-12-16 LAB — PROTIME-INR
INR: 1.1 (ref 0.8–1.2)
Prothrombin Time: 14.8 s (ref 11.4–15.2)

## 2023-12-16 LAB — TROPONIN I (HIGH SENSITIVITY): Troponin I (High Sensitivity): 17 ng/L (ref <18)

## 2023-12-16 LAB — BRAIN NATRIURETIC PEPTIDE: B Natriuretic Peptide: 525.5 pg/mL — ABNORMAL HIGH (ref 0.0–100.0)

## 2023-12-16 MED ORDER — METOPROLOL SUCCINATE ER 25 MG PO TB24
12.5000 mg | ORAL_TABLET | Freq: Once | ORAL | Status: AC
  Administered 2023-12-16: 12.5 mg via ORAL
  Filled 2023-12-16: qty 1

## 2023-12-16 NOTE — ED Notes (Signed)
 Pt ambulated w 2 wheel walker. Pt denies dizziness or SOB. Family at bedside states she is closer to her baseline with walking. Gate is steady. Pt returned to bed without incident. Hr peaked to 108.

## 2023-12-16 NOTE — Discharge Instructions (Addendum)
 Hold 1 dose of Lasix  tomorrow to see if this helps with her symptoms.  Monitor her blood pressure and heart rate once in the morning and once at night to see if they need to adjust her metoprolol .  Call your primary care doctor or cardiologist to make a follow-up appointment.  Return to the ER for worsening symptoms or any other concerns.

## 2023-12-16 NOTE — ED Notes (Signed)
 This RN called lab to add on trop

## 2023-12-16 NOTE — ED Triage Notes (Signed)
 Pt BIB [facility] from home c/o new onset of dizzeness occurring over the past few days. Dizziness only when standing per pt.  HX afib, CHF. AxO. Denies Chest pain, falls or SOB  150/90

## 2023-12-16 NOTE — ED Notes (Signed)
 Bagged lunch and water given by thisRN

## 2023-12-16 NOTE — ED Provider Notes (Signed)
[facility] Provider Note    Event Date/Time   First MD Initiated Contact with Patient 12/16/23 1519     (approximate)   History   Dizziness   HPI  Gina White is a 89 y.o. female with history of GERD, stroke, hypertension, A-fib, aortic valve insufficiency, CHF who comes in with concerns for dizziness.  Patient really denies any dizziness with laying down.  She states that over the past 3 weeks she has had a little bit of lightheadedness when she stands up she feels wobbly and feels like she is going to fall over.  She reports that she has not had any falls denies any chest pain, shortness of breath, urinary symptoms.  She denies any abdominal pain.  I reviewed a note from 10/03/2023 where it was commented on patient having some generalized weakness.  She was also seen on 12/11/2023.  They did discontinue her warfarin due to her age with high risk for falls, bleeding.   Physical Exam   Triage Vital Signs: ED Triage Vitals  Encounter Vitals Group     BP 12/16/23 1512 (!) 163/102     Systolic BP Percentile --      Diastolic BP Percentile --      Pulse Rate 12/16/23 1512 93     Resp 12/16/23 1512 19     Temp --      Temp src --      SpO2 12/16/23 1512 98 %     Weight 12/16/23 1513 145 lb 8.1 oz (66 kg)     Height 12/16/23 1513 5\' 1"  (1.549 m)     Head Circumference --      Peak Flow --      Pain Score 12/16/23 1513 0     Pain Loc --      Pain Education --      Exclude from Growth Chart --     Most recent vital signs: Vitals:   12/16/23 1512  BP: (!) 163/102  Pulse: 93  Resp: 19  SpO2: 98%     General: Awake, no distress.  CV:  Good peripheral perfusion.  Resp:  Normal effort.  Abd:  No distention.  Soft and nontender Other:  Cranial nerves II to XII are intact.  Equal strength in arms and legs.  Finger-to-nose intact bilaterally   ED Results / Procedures / Treatments   Labs (all labs ordered are listed, but only abnormal  results are displayed) Labs Reviewed  CBC WITH DIFFERENTIAL/PLATELET  COMPREHENSIVE METABOLIC PANEL WITH GFR  URINALYSIS, ROUTINE W REFLEX MICROSCOPIC  BRAIN NATRIURETIC PEPTIDE  PROTIME-INR     EKG  My interpretation of EKG:  Atrial fibrillation with a rate of 93 without any ST elevation or T wave inversions, normal intervals  RADIOLOGY I have reviewed the xray personally and interpreted mild cardiomegaly   PROCEDURES:  Critical Care performed: No  .1-3 Lead EKG Interpretation  Performed by: Lubertha Rush, MD Authorized by: Lubertha Rush, MD     Interpretation: abnormal     ECG rate:  90   ECG rate assessment: normal     Rhythm: atrial fibrillation     Ectopy: none     Conduction: normal      MEDICATIONS ORDERED IN ED: Medications  metoprolol  succinate (TOPROL -XL) 24 hr tablet 12.5 mg (12.5 mg Oral Given 12/16/23 1753)     IMPRESSION / MDM / ASSESSMENT AND PLAN / ED COURSE  I reviewed the triage vital signs  and the nursing notes.   Patient's presentation is most consistent with acute presentation with potential threat to life or bodily function.   Patient comes in with some lightheadedness when she stands up.  Per EMS she had orthostatics that were negative.  I reviewed an echocardiogram from 2023 where patient was noted to have aortic valve regurgitation that was moderate to severe as well as mitral valve regurgitation moderate to severe in tricuspid valve regurgitation moderate to severe with a EF of 44 to 45%.  We will check labs to make sure there is no signs of heart attack, electrolyte abnormalities, UTI, CT head to make sure no evidence of intercranial hemorrhage her neuroexam seems to be reassuring without any evidence of obvious posterior stroke open she reports this been an ongoing issue for about 3 weeks therefore an acute stroke seems less likely as this is mostly occurring with position changes.  Stroke code was not called given out of the window for  stroke code.  Urine without evidence of UTI.  CBC is reassuring.  CMP reassuring BNP slightly elevated but she is got no signs of fluid overload on exam.  Her troponin is negative and she denied any chest pain.  Family is now at bedside I discussed with them further about patient's lightheadedness.  This has been a new an issue for the past few weeks mostly in the mornings with standing she really does not have many symptoms at this time.  We discussed the possibility of posterior stroke and we discussed MRI but often declined given patient's age and low chance that this is a stroke and the fact that if it was a stroke patient would be high risk for anticoagulation for her A-fib given her bleeds.  However on my examination I really have low suspicion for acute stroke and family expressed understanding and felt comfortable holding off on MRI  We discussed with family that this could be related to a little bit of overdiuresis she also did not take her metoprolol  yet this morning which is making her A-fib a little bit of fast.  Patient given her dose of metoprolol .  We discussed IV fluids versus holding a dose of Lasix  and they prefer to just hold her dose of Lasix .  Patient was tolerated eating a meal and she was able to get up and walk with her walker.  She denied any symptoms of lightheadedness at this time.  At this time they feel comfortable going home and will trial holding a dose of Lasix  tomorrow and calling her primary care doctor for follow-up.  We discussed having a protein bar at bedside before trying to get up and slow position changes due to her history of valve regurgitation that just may take some time for her body to equilibrate.  I considered admission for patient but given her reassuring blood work and vital signs they felt comfortable with outpatient follow-up with cardiology or PCP return to the ER with worsening symptoms or any other concerns  The patient is on the cardiac monitor to  evaluate for evidence of arrhythmia and/or significant heart rate changes.      FINAL CLINICAL IMPRESSION(S) / ED DIAGNOSES   Final diagnoses:  Lightheadedness     Rx / DC Orders   ED Discharge Orders     None        Note:  This document was prepared using Dragon voice recognition software and may include unintentional dictation errors.   Lubertha Rush, MD 12/16/23  1925  

## 2023-12-16 NOTE — ED Notes (Signed)
 Apt ambulatory with walker to toilet. NADN.

## 2024-04-21 ENCOUNTER — Other Ambulatory Visit: Payer: Self-pay

## 2024-04-21 DIAGNOSIS — I4819 Other persistent atrial fibrillation: Secondary | ICD-10-CM | POA: Insufficient documentation

## 2024-04-21 DIAGNOSIS — M545 Low back pain, unspecified: Secondary | ICD-10-CM | POA: Insufficient documentation

## 2024-04-21 LAB — CBC WITH DIFFERENTIAL/PLATELET
Abs Immature Granulocytes: 0.07 K/uL (ref 0.00–0.07)
Basophils Absolute: 0 K/uL (ref 0.0–0.1)
Basophils Relative: 0 %
Eosinophils Absolute: 0 K/uL (ref 0.0–0.5)
Eosinophils Relative: 0 %
HCT: 34 % — ABNORMAL LOW (ref 36.0–46.0)
Hemoglobin: 11 g/dL — ABNORMAL LOW (ref 12.0–15.0)
Immature Granulocytes: 1 %
Lymphocytes Relative: 19 %
Lymphs Abs: 1.2 K/uL (ref 0.7–4.0)
MCH: 31.6 pg (ref 26.0–34.0)
MCHC: 32.4 g/dL (ref 30.0–36.0)
MCV: 97.7 fL (ref 80.0–100.0)
Monocytes Absolute: 0.7 K/uL (ref 0.1–1.0)
Monocytes Relative: 11 %
Neutro Abs: 4.3 K/uL (ref 1.7–7.7)
Neutrophils Relative %: 69 %
Platelets: 223 K/uL (ref 150–400)
RBC: 3.48 MIL/uL — ABNORMAL LOW (ref 3.87–5.11)
RDW: 13 % (ref 11.5–15.5)
WBC: 6.2 K/uL (ref 4.0–10.5)
nRBC: 0.3 % — ABNORMAL HIGH (ref 0.0–0.2)

## 2024-04-21 LAB — TROPONIN I (HIGH SENSITIVITY): Troponin I (High Sensitivity): 15 ng/L (ref <18)

## 2024-04-21 LAB — BASIC METABOLIC PANEL WITH GFR
Anion gap: 14 (ref 5–15)
BUN: 19 mg/dL (ref 8–23)
CO2: 19 mmol/L — ABNORMAL LOW (ref 22–32)
Calcium: 9.4 mg/dL (ref 8.9–10.3)
Chloride: 98 mmol/L (ref 98–111)
Creatinine, Ser: 0.82 mg/dL (ref 0.44–1.00)
GFR, Estimated: >60 mL/min (ref >60)
Glucose, Bld: 123 mg/dL — ABNORMAL HIGH (ref 70–99)
Potassium: 5 mmol/L (ref 3.5–5.1)
Sodium: 131 mmol/L — ABNORMAL LOW (ref 135–145)

## 2024-04-21 NOTE — ED Triage Notes (Signed)
 Pt to ED via EMS from home, pt reports her heart hasn't been beating right for 5-6 months, pt states she began to have some pain tonight so she called EMS but cannot recall where her pain was. Pt states she fell 1 week ago and has had inc in her lower back pain. Pt recently started on prednisone  and a muscle relaxer for her back pain per family and since hasn't been feeling well.

## 2024-04-22 ENCOUNTER — Emergency Department
Admission: EM | Admit: 2024-04-22 | Discharge: 2024-04-22 | Disposition: A | Discharge status: Home / Self Care | Attending: Emergency Medicine | Admitting: Emergency Medicine

## 2024-04-22 DIAGNOSIS — I482 Chronic atrial fibrillation, unspecified: Secondary | ICD-10-CM

## 2024-04-22 DIAGNOSIS — R531 Weakness: Secondary | ICD-10-CM

## 2024-04-22 LAB — URINALYSIS, ROUTINE W REFLEX MICROSCOPIC
Bacteria, UA: NONE SEEN
Bilirubin Urine: NEGATIVE
Glucose, UA: >=500 mg/dL — AB
Hgb urine dipstick: NEGATIVE
Ketones, ur: 5 mg/dL — AB
Leukocytes,Ua: NEGATIVE
Nitrite: NEGATIVE
Protein, ur: NEGATIVE mg/dL
Specific Gravity, Urine: 1.02 (ref 1.005–1.030)
Squamous Epithelial / HPF: 0 /HPF (ref 0–5)
pH: 5 (ref 5.0–8.0)

## 2024-04-22 LAB — TROPONIN I (HIGH SENSITIVITY): Troponin I (High Sensitivity): 15 ng/L (ref <18)

## 2024-04-22 NOTE — Discharge Instructions (Signed)
 As we discussed, your evaluation was generally reassuring.  Although your heart rate is a little bit elevated from normal, it is very unlikely that you would benefit from coming into the hospital.  Particularly given your strong preference to go home, we think this is reasonable, but encourage you to continue taking your regular medications and follow-up with your normal doctors (Dr. Lenon and Dr. Florencio) at the next available opportunity.  Return to the emergency department if you develop new or worsening symptoms that concern you.

## 2024-04-22 NOTE — ED Provider Notes (Signed)
 [facility] Provider Note    Event Date/Time   First MD Initiated Contact with Patient 04/22/24 [phone]     (approximate)   History   Weakness   HPI Gina White is a 89 y.o. female who is generally active and oriented despite her advanced age but who does have a medical history that includes but is not limited to chronic atrial fibrillation.  She presents tonight for general malaise.  She says she does not think her heart has been beating correctly for the last few months.  She has also been having some low back pain recently and was started on prednisone .  She had an episode tonight where her blood pressure was higher than usual and her daughter said that she was maybe having a little bit of difficulty breathing.  The patient denies having chest pain.  She says she has no abdominal pain, nausea, vomiting, nor dysuria.  She has not had a fever.  She has no headache or neck pain.  She has not had any recent falls.  She has been compliant with her medications including her medications tonight.     Physical Exam   Triage Vital Signs: ED Triage Vitals [04/21/24 2301]  Encounter Vitals Group     BP (!) 159/94     Girls Systolic BP Percentile      Girls Diastolic BP Percentile      Boys Systolic BP Percentile      Boys Diastolic BP Percentile      Pulse Rate (!) 104     Resp 16     Temp 98.7 F (37.1 C)     Temp Source Oral     SpO2 98 %     Weight 59 kg (130 lb)     Height 1.549 m (5' 1)     Head Circumference      Peak Flow      Pain Score 7     Pain Loc      Pain Education      Exclude from Growth Chart     Most recent vital signs: Vitals:   04/22/24 0200 04/22/24 0230  BP: (!) 163/90 (!) 146/93  Pulse: (!) 102 (!) 108  Resp: 19 (!) 21  Temp:  98.5 F (36.9 C)  SpO2: 100% 100%    General: Awake, alert and oriented despite her advanced age. CV:  Good peripheral perfusion.  Irregularly irregular rhythm with mild  tachycardia Resp:  Normal effort. Speaking easily and comfortably, no accessory muscle usage nor intercostal retractions.  Lungs are clear to auscultation. Abd:  No distention.  No tenderness to palpation of the abdomen. Other:  Pleasant and conversant, mentally sharp, no focal neurological deficits   ED Results / Procedures / Treatments   Labs (all labs ordered are listed, but only abnormal results are displayed) Labs Reviewed  CBC WITH DIFFERENTIAL/PLATELET - Abnormal; Notable for the following components:      Result Value   RBC 3.48 (*)    Hemoglobin 11.0 (*)    HCT 34.0 (*)    nRBC 0.3 (*)    All other components within normal limits  BASIC METABOLIC PANEL WITH GFR - Abnormal; Notable for the following components:   Sodium 131 (*)    CO2 19 (*)    Glucose, Bld 123 (*)    All other components within normal limits  URINALYSIS, ROUTINE W REFLEX MICROSCOPIC - Abnormal; Notable for the following components:   Color, Urine YELLOW (*)  APPearance CLEAR (*)    Glucose, UA >=500 (*)    Ketones, ur 5 (*)    All other components within normal limits  TROPONIN I (HIGH SENSITIVITY)  TROPONIN I (HIGH SENSITIVITY)     EKG  ED ECG REPORT I, Darleene Dome, the attending physician, personally viewed and interpreted this ECG.  Date: 04/21/2024 EKG Time: 23: 03 Rate: 111 Rhythm: A-fib with RVR QRS Axis: normal Intervals: normal ST/T Wave abnormalities: Non-specific ST segment / T-wave changes, but no clear evidence of acute ischemia. Narrative Interpretation: no definitive evidence of acute ischemia; does not meet STEMI criteria.     PROCEDURES:  Critical Care performed: No  Procedures    IMPRESSION / MDM / ASSESSMENT AND PLAN / ED COURSE  I reviewed the triage vital signs and the nursing notes.                              Differential diagnosis includes, but is not limited to, A-fib with RVR (poorly controlled rate), electrolyte or metabolic abnormality, acute  infection such as UTI  Patient's presentation is most consistent with acute presentation with potential threat to life or bodily function.  Labs/studies ordered: EKG, CBC with differential, high-sensitivity troponin, urinalysis, basic metabolic panel  Interventions/Medications given:  Medications - No data to display  (Note:  hospital course my include additional interventions and/or labs/studies not listed above.)   Patient's vital signs are generally reassuring.  She has some mild tachycardia/RVR with her chronic atrial fibrillation but the rate is relatively low.  She appears that she could be a little bit volume depleted but she and her daughter both state that she has been eating and drinking normally.  She has no leukocytosis and no fever and a normal urinalysis.  There is no indication she is having an emergent medical condition.  I strongly considered hospitalization given her age and her tachycardia, but I do not think it would be of benefit.  The patient VERY strongly wants to go home and I think that is very reasonable for her age and the strong probability that coming into the hospital with an unclear diagnosis or plan of care will make her worse rather than better.  She and her daughter are comfortable with the plan to closely follow-up with Dr. Lenon her PCP, as well as Dr. Florencio her cardiologist.  I encouraged her to continue taking her medications and try to eat and drink normally and I gave her strict return precautions.  They agree with the plan.         FINAL CLINICAL IMPRESSION(S) / ED DIAGNOSES   Final diagnoses:  Generalized weakness  Chronic atrial fibrillation with RVR (HCC)     Rx / DC Orders   ED Discharge Orders          Ordered    Ambulatory referral to Cardiology       Comments: If you have not heard from the Cardiology office within the next 72 hours please call [phone].   04/22/24 0306             Note:  This document was  prepared using Dragon voice recognition software and may include unintentional dictation errors.   Dome Darleene, MD 04/22/24 [phone]

## 2024-04-24 ENCOUNTER — Ambulatory Visit: Admitting: Cardiology

## 2024-05-03 ENCOUNTER — Emergency Department

## 2024-05-03 ENCOUNTER — Emergency Department: Admission: EM | Admit: 2024-05-03 | Discharge: 2024-05-03 | Disposition: A | Discharge status: Home / Self Care

## 2024-05-03 ENCOUNTER — Other Ambulatory Visit: Payer: Self-pay

## 2024-05-03 DIAGNOSIS — I509 Heart failure, unspecified: Secondary | ICD-10-CM | POA: Diagnosis not present

## 2024-05-03 DIAGNOSIS — R079 Chest pain, unspecified: Secondary | ICD-10-CM | POA: Diagnosis present

## 2024-05-03 DIAGNOSIS — I10 Essential (primary) hypertension: Secondary | ICD-10-CM | POA: Insufficient documentation

## 2024-05-03 DIAGNOSIS — K219 Gastro-esophageal reflux disease without esophagitis: Secondary | ICD-10-CM | POA: Diagnosis not present

## 2024-05-03 DIAGNOSIS — M549 Dorsalgia, unspecified: Secondary | ICD-10-CM | POA: Insufficient documentation

## 2024-05-03 DIAGNOSIS — I351 Nonrheumatic aortic (valve) insufficiency: Secondary | ICD-10-CM | POA: Insufficient documentation

## 2024-05-03 DIAGNOSIS — M6281 Muscle weakness (generalized): Secondary | ICD-10-CM | POA: Diagnosis present

## 2024-05-03 DIAGNOSIS — I4891 Unspecified atrial fibrillation: Secondary | ICD-10-CM | POA: Insufficient documentation

## 2024-05-03 DIAGNOSIS — R531 Weakness: Secondary | ICD-10-CM | POA: Insufficient documentation

## 2024-05-03 DIAGNOSIS — I110 Hypertensive heart disease with heart failure: Secondary | ICD-10-CM | POA: Diagnosis not present

## 2024-05-03 DIAGNOSIS — Z8673 Personal history of transient ischemic attack (TIA), and cerebral infarction without residual deficits: Secondary | ICD-10-CM | POA: Insufficient documentation

## 2024-05-03 LAB — CBC WITH DIFFERENTIAL/PLATELET
Abs Immature Granulocytes: 0.04 K/uL (ref 0.00–0.07)
Basophils Absolute: 0 K/uL (ref 0.0–0.1)
Basophils Relative: 1 %
Eosinophils Absolute: 0.2 K/uL (ref 0.0–0.5)
Eosinophils Relative: 3 %
HCT: 38.9 % (ref 36.0–46.0)
Hemoglobin: 12.6 g/dL (ref 12.0–15.0)
Immature Granulocytes: 1 %
Lymphocytes Relative: 23 %
Lymphs Abs: 1.7 K/uL (ref 0.7–4.0)
MCH: 32.4 pg (ref 26.0–34.0)
MCHC: 32.4 g/dL (ref 30.0–36.0)
MCV: 100 fL (ref 80.0–100.0)
Monocytes Absolute: 1.2 K/uL — ABNORMAL HIGH (ref 0.1–1.0)
Monocytes Relative: 16 %
Neutro Abs: 4.3 K/uL (ref 1.7–7.7)
Neutrophils Relative %: 56 %
Platelets: 223 K/uL (ref 150–400)
RBC: 3.89 MIL/uL (ref 3.87–5.11)
RDW: 14.2 % (ref 11.5–15.5)
WBC: 7.4 K/uL (ref 4.0–10.5)
nRBC: 0 % (ref 0.0–0.2)

## 2024-05-03 LAB — URINALYSIS, COMPLETE (UACMP) WITH MICROSCOPIC
Bacteria, UA: NONE SEEN
Bilirubin Urine: NEGATIVE
Glucose, UA: >=500 mg/dL — AB
Hgb urine dipstick: NEGATIVE
Ketones, ur: NEGATIVE mg/dL
Leukocytes,Ua: NEGATIVE
Nitrite: NEGATIVE
Protein, ur: NEGATIVE mg/dL
RBC / HPF: 0 RBC/hpf (ref 0–5)
Specific Gravity, Urine: 1.006 (ref 1.005–1.030)
pH: 7 (ref 5.0–8.0)

## 2024-05-03 LAB — COMPREHENSIVE METABOLIC PANEL WITH GFR
ALT: 13 U/L (ref 0–44)
AST: 29 U/L (ref 15–41)
Albumin: 3.7 g/dL (ref 3.5–5.0)
Alkaline Phosphatase: 56 U/L (ref 38–126)
Anion gap: 7 (ref 5–15)
BUN: 17 mg/dL (ref 8–23)
CO2: 28 mmol/L (ref 22–32)
Calcium: 9.5 mg/dL (ref 8.9–10.3)
Chloride: 97 mmol/L — ABNORMAL LOW (ref 98–111)
Creatinine, Ser: 0.74 mg/dL (ref 0.44–1.00)
GFR, Estimated: >60 mL/min (ref >60)
Glucose, Bld: 145 mg/dL — ABNORMAL HIGH (ref 70–99)
Potassium: 5 mmol/L (ref 3.5–5.1)
Sodium: 132 mmol/L — ABNORMAL LOW (ref 135–145)
Total Bilirubin: 1 mg/dL (ref 0.0–1.2)
Total Protein: 6.7 g/dL (ref 6.5–8.1)

## 2024-05-03 LAB — PROTIME-INR
INR: 1.1 (ref 0.8–1.2)
Prothrombin Time: 14.9 s (ref 11.4–15.2)

## 2024-05-03 LAB — RESP PANEL BY RT-PCR (RSV, FLU A&B, COVID)  RVPGX2
Influenza A by PCR: NEGATIVE
Influenza B by PCR: NEGATIVE
Resp Syncytial Virus by PCR: NEGATIVE
SARS Coronavirus 2 by RT PCR: NEGATIVE

## 2024-05-03 LAB — MAGNESIUM: Magnesium: 2.5 mg/dL — ABNORMAL HIGH (ref 1.7–2.4)

## 2024-05-03 LAB — BRAIN NATRIURETIC PEPTIDE: B Natriuretic Peptide: 262.3 pg/mL — ABNORMAL HIGH (ref 0.0–100.0)

## 2024-05-03 LAB — PROCALCITONIN: Procalcitonin: <0.1 ng/mL

## 2024-05-03 NOTE — ED Triage Notes (Signed)
 BIB EMS for generalized weakness x 2 days. A&Ox4 and ambulatory at baseline. Hx of A. Fib. Denies any urinary symptoms at this time.

## 2024-05-03 NOTE — ED Provider Notes (Signed)
 [facility] Provider Note    Event Date/Time   First MD Initiated Contact with Patient 05/03/24 2012     (approximate)   History   Weakness   HPI  Gina White is a 89 y.o. female with a past medical history of GERD, stroke, hypertension, A-fib (Coumadin recently discontinued), aortic valve insufficiency CHF who presents from home with 2 days of progressively worsening generalized weakness.  Patient states that she feels as if something is  off.  Denies any chest pain or shortness of breath.  States that she is having difficulty getting around secondary to weakness.  Denies any recent falls, any headache, focal extremity weakness or sensation changes.  Patient presents via EMS and glucose was greater than 100 en route.        Physical Exam   Triage Vital Signs: ED Triage Vitals  Encounter Vitals Group     BP      Girls Systolic BP Percentile      Girls Diastolic BP Percentile      Boys Systolic BP Percentile      Boys Diastolic BP Percentile      Pulse      Resp      Temp      Temp src      SpO2      Weight      Height      Head Circumference      Peak Flow      Pain Score      Pain Loc      Pain Education      Exclude from Growth Chart     Most recent vital signs: Vitals:   05/03/24 2029  BP: 125/78  Pulse: 95  Resp: 18  Temp: 97.6 F (36.4 C)  SpO2: 99%    Nursing Triage Note reviewed. Vital signs reviewed and patients oxygen saturation is normoxic  General: Patient is thin well developed, awake and alert, resting comfortably in no acute distress Head: Normocephalic and atraumatic Eyes: Normal inspection, extraocular muscles intact, no conjunctival pallor Ear, nose, throat: Normal external exam Neck: Normal range of motion Respiratory: Patient is in no respiratory distress, lungs CTAB Cardiovascular: Patient is not tachycardic, RR GI: Abd SNT with no guarding or rebound  Back: Normal inspection of the back with  good strength and range of motion throughout all ext Extremities: pulses intact with good cap refills, no LE pitting edema or calf tenderness Neuro: The patient is alert and oriented to person, place, and time, appropriately conversive, with 5/5 bilat UE/LE strength, no gross motor or sensory defects noted. Coordination appears to be adequate. Skin: Some ecchymosis on bilateral feet Psych: normal mood and affect, no SI or HI  ED Results / Procedures / Treatments   Labs (all labs ordered are listed, but only abnormal results are displayed) Labs Reviewed  CBC WITH DIFFERENTIAL/PLATELET - Abnormal; Notable for the following components:      Result Value   Monocytes Absolute 1.2 (*)    All other components within normal limits  COMPREHENSIVE METABOLIC PANEL WITH GFR - Abnormal; Notable for the following components:   Sodium 132 (*)    Chloride 97 (*)    Glucose, Bld 145 (*)    All other components within normal limits  URINALYSIS, COMPLETE (UACMP) WITH MICROSCOPIC - Abnormal; Notable for the following components:   Color, Urine YELLOW (*)    APPearance CLEAR (*)    Glucose, UA >=500 (*)  All other components within normal limits  BRAIN NATRIURETIC PEPTIDE - Abnormal; Notable for the following components:   B Natriuretic Peptide 262.3 (*)    All other components within normal limits  MAGNESIUM - Abnormal; Notable for the following components:   Magnesium 2.5 (*)    All other components within normal limits  RESP PANEL BY RT-PCR (RSV, FLU A&B, COVID)  [id number]  PROCALCITONIN  PROTIME-INR     EKG EKG and rhythm strip are interpreted by myself:   EKG: Atrial fibrillation at heart rate of 86, normal QRS duration, QTc 424, nonspecific ST segments and T waves no ectopy EKG not consistent with Acute STEMI Rhythm strip: Atrial fibrillation in lead II   RADIOLOGY Xray chest: No acute abnormality on my independent review interpretation    PROCEDURES:  Critical Care performed:  No  Procedures   MEDICATIONS ORDERED IN ED: Medications - No data to display   IMPRESSION / MDM / ASSESSMENT AND PLAN / ED COURSE                                Differential diagnosis includes, but is not limited to, UTI, pneumonia, respiratory infection, anemia, arrhythmia, CHF  ED course: Patient presents with 2 days of progressively worsening generalized weakness.  She has no focal neurological deficits on exam.  Her EKG does demonstrate atrial fibrillation however she is rate controlled.  I did consider PE however she is satting 99% on room air and denies any shortness of breath.  BNP was obtained given the constant A-fib and to evaluate for any decompensated CHF.  She does not have a leukocytosis or any anemia.  Remainder of labs are pending   Clinical Course as of 05/03/24 2353  Sat May 03, 2024  2114 CBC with Differential(!) No leukocytosis [HD]  2148 Brain natriuretic peptide(!) Better than baseline [HD]  2148 Comprehensive metabolic panel(!) No profound electrolyte derangements [HD]  2148 Urinalysis, Complete w Microscopic -Urine, Clean Catch(!) Does not seem consistent with UTI [HD]  2148 DG Chest 2 View Chest x-ray is unremarkable [HD]  2203 Patient's daughter at bedside.  Reviewed workup and they felt reassured.  Patient will have a low threshold to return with any acutely worsening symptoms.  She will follow-up with her primary care physician [HD]    Clinical Course User Index [HD] Nicholaus Rolland BRAVO, MD   At time of discharge there is no evidence of acute life, limb, vision, or fertility threat. Patient has stable vital signs, pain is well controlled, patient is ambulatory and p.o. tolerant.  Discharge instructions were completed using the EPIC system. I would refer you to those at this time. All warnings prescriptions follow-up etc. were discussed in detail with the patient. Patient indicates understanding and is agreeable with this plan. All questions  answered.  Patient is made aware that they may return to the emergency department for any worsening or new condition or for any other emergency.   -- Risk: 5 This patient has a high risk of morbidity due to further diagnostic testing or treatment. Rationale: This patient's evaluation and management involve a high risk of morbidity due to the potential severity of presenting symptoms, need for diagnostic testing, and/or initiation of treatment that may require close monitoring. The differential includes conditions with potential for significant deterioration or requiring escalation of care. Treatment decisions in the ED, including medication administration, procedural interventions, or disposition planning, reflect this level of risk.  COPA: 5 The patient has the following acute or chronic illness/injury that poses a possible threat to life or bodily function: [X] : The patient has a potentially serious acute condition or an acute exacerbation of a chronic illness requiring urgent evaluation and management in the Emergency Department. The clinical presentation necessitates immediate consideration of life-threatening or function-threatening diagnoses, even if they are ultimately ruled out.   FINAL CLINICAL IMPRESSION(S) / ED DIAGNOSES   Final diagnoses:  General weakness     Rx / DC Orders   ED Discharge Orders     None        Note:  This document was prepared using Dragon voice recognition software and may include unintentional dictation errors.   Nicholaus Rolland BRAVO, MD 05/03/24 [phone]

## 2024-05-03 NOTE — Discharge Instructions (Signed)

## 2024-05-06 ENCOUNTER — Other Ambulatory Visit: Payer: Self-pay | Admitting: Family Medicine

## 2024-05-06 DIAGNOSIS — S32030A Wedge compression fracture of third lumbar vertebra, initial encounter for closed fracture: Secondary | ICD-10-CM

## 2024-05-07 ENCOUNTER — Ambulatory Visit
Admission: RE | Admit: 2024-05-07 | Discharge: 2024-05-07 | Disposition: A | Discharge status: Home / Self Care | Source: Ambulatory Visit | Attending: Family Medicine | Admitting: Family Medicine

## 2024-05-07 DIAGNOSIS — S32030A Wedge compression fracture of third lumbar vertebra, initial encounter for closed fracture: Secondary | ICD-10-CM | POA: Insufficient documentation
